# Patient Record
Sex: Male | Born: 1940 | Hispanic: Yes | Marital: Married | Smoking: Never smoker
Health system: Southern US, Community
[De-identification: ages and names within clinical notes are randomized; demographics above are authoritative.]

## PROBLEM LIST (undated history)

## (undated) DIAGNOSIS — K2970 Gastritis, unspecified, without bleeding: Secondary | ICD-10-CM

## (undated) DIAGNOSIS — I700 Atherosclerosis of aorta: Secondary | ICD-10-CM

## (undated) DIAGNOSIS — I5022 Chronic systolic (congestive) heart failure: Secondary | ICD-10-CM

## (undated) DIAGNOSIS — I4819 Other persistent atrial fibrillation: Secondary | ICD-10-CM

## (undated) DIAGNOSIS — D649 Anemia, unspecified: Secondary | ICD-10-CM

## (undated) DIAGNOSIS — I2699 Other pulmonary embolism without acute cor pulmonale: Secondary | ICD-10-CM

## (undated) DIAGNOSIS — K589 Irritable bowel syndrome without diarrhea: Secondary | ICD-10-CM

## (undated) DIAGNOSIS — J984 Other disorders of lung: Secondary | ICD-10-CM

## (undated) HISTORY — DX: Chronic systolic (congestive) heart failure: I50.22

## (undated) HISTORY — DX: Other pulmonary embolism without acute cor pulmonale: I26.99

## (undated) HISTORY — DX: Atherosclerosis of aorta: I70.0

## (undated) HISTORY — DX: Anemia, unspecified: D64.9

## (undated) HISTORY — DX: Irritable bowel syndrome, unspecified: K58.9

## (undated) HISTORY — DX: Other disorders of lung: J98.4

## (undated) HISTORY — DX: Other persistent atrial fibrillation: I48.19

---

## 2021-03-03 ENCOUNTER — Ambulatory Visit (INDEPENDENT_AMBULATORY_CARE_PROVIDER_SITE_OTHER): Payer: Self-pay

## 2021-03-03 ENCOUNTER — Encounter (HOSPITAL_COMMUNITY): Payer: Self-pay | Admitting: Emergency Medicine

## 2021-03-03 ENCOUNTER — Ambulatory Visit (HOSPITAL_COMMUNITY)
Admission: EM | Admit: 2021-03-03 | Discharge: 2021-03-03 | Disposition: A | Discharge status: Home / Self Care | Payer: Self-pay | Attending: Urgent Care | Admitting: Urgent Care

## 2021-03-03 ENCOUNTER — Other Ambulatory Visit: Payer: Self-pay

## 2021-03-03 ENCOUNTER — Emergency Department (HOSPITAL_COMMUNITY): Payer: Self-pay

## 2021-03-03 ENCOUNTER — Inpatient Hospital Stay (HOSPITAL_COMMUNITY)
Admission: EM | Admit: 2021-03-03 | Discharge: 2021-03-11 | DRG: 640 | Disposition: A | Discharge status: Home / Self Care | Payer: Self-pay | Source: Ambulatory Visit | Attending: Internal Medicine | Admitting: Internal Medicine

## 2021-03-03 DIAGNOSIS — E440 Moderate protein-calorie malnutrition: Secondary | ICD-10-CM | POA: Insufficient documentation

## 2021-03-03 DIAGNOSIS — K5900 Constipation, unspecified: Secondary | ICD-10-CM

## 2021-03-03 DIAGNOSIS — I452 Bifascicular block: Secondary | ICD-10-CM | POA: Diagnosis present

## 2021-03-03 DIAGNOSIS — R269 Unspecified abnormalities of gait and mobility: Secondary | ICD-10-CM | POA: Diagnosis present

## 2021-03-03 DIAGNOSIS — R1030 Lower abdominal pain, unspecified: Secondary | ICD-10-CM | POA: Diagnosis present

## 2021-03-03 DIAGNOSIS — I700 Atherosclerosis of aorta: Secondary | ICD-10-CM | POA: Diagnosis present

## 2021-03-03 DIAGNOSIS — R112 Nausea with vomiting, unspecified: Secondary | ICD-10-CM

## 2021-03-03 DIAGNOSIS — R9431 Abnormal electrocardiogram [ECG] [EKG]: Secondary | ICD-10-CM

## 2021-03-03 DIAGNOSIS — R531 Weakness: Secondary | ICD-10-CM

## 2021-03-03 DIAGNOSIS — I5021 Acute systolic (congestive) heart failure: Secondary | ICD-10-CM | POA: Diagnosis not present

## 2021-03-03 DIAGNOSIS — R001 Bradycardia, unspecified: Secondary | ICD-10-CM | POA: Diagnosis not present

## 2021-03-03 DIAGNOSIS — Z7901 Long term (current) use of anticoagulants: Secondary | ICD-10-CM

## 2021-03-03 DIAGNOSIS — I480 Paroxysmal atrial fibrillation: Secondary | ICD-10-CM

## 2021-03-03 DIAGNOSIS — I4581 Long QT syndrome: Secondary | ICD-10-CM | POA: Diagnosis present

## 2021-03-03 DIAGNOSIS — R140 Abdominal distension (gaseous): Secondary | ICD-10-CM | POA: Diagnosis present

## 2021-03-03 DIAGNOSIS — R54 Age-related physical debility: Secondary | ICD-10-CM | POA: Diagnosis present

## 2021-03-03 DIAGNOSIS — R0902 Hypoxemia: Secondary | ICD-10-CM

## 2021-03-03 DIAGNOSIS — E871 Hypo-osmolality and hyponatremia: Principal | ICD-10-CM | POA: Diagnosis present

## 2021-03-03 DIAGNOSIS — I429 Cardiomyopathy, unspecified: Secondary | ICD-10-CM | POA: Diagnosis present

## 2021-03-03 DIAGNOSIS — I4819 Other persistent atrial fibrillation: Secondary | ICD-10-CM | POA: Diagnosis present

## 2021-03-03 DIAGNOSIS — I110 Hypertensive heart disease with heart failure: Secondary | ICD-10-CM | POA: Diagnosis present

## 2021-03-03 DIAGNOSIS — E861 Hypovolemia: Secondary | ICD-10-CM | POA: Diagnosis present

## 2021-03-03 DIAGNOSIS — D649 Anemia, unspecified: Secondary | ICD-10-CM | POA: Diagnosis present

## 2021-03-03 DIAGNOSIS — Z20822 Contact with and (suspected) exposure to covid-19: Secondary | ICD-10-CM | POA: Diagnosis present

## 2021-03-03 DIAGNOSIS — E860 Dehydration: Secondary | ICD-10-CM

## 2021-03-03 DIAGNOSIS — K581 Irritable bowel syndrome with constipation: Secondary | ICD-10-CM | POA: Diagnosis present

## 2021-03-03 HISTORY — DX: Gastritis, unspecified, without bleeding: K29.70

## 2021-03-03 LAB — URINALYSIS, ROUTINE W REFLEX MICROSCOPIC
Bilirubin Urine: NEGATIVE
Glucose, UA: NEGATIVE mg/dL
Ketones, ur: 5 mg/dL — AB
Leukocytes,Ua: NEGATIVE
Nitrite: NEGATIVE
Protein, ur: NEGATIVE mg/dL
Specific Gravity, Urine: 1.019 (ref 1.005–1.030)
pH: 5 (ref 5.0–8.0)

## 2021-03-03 LAB — COMPREHENSIVE METABOLIC PANEL
ALT: 20 U/L (ref 0–44)
AST: 61 U/L — ABNORMAL HIGH (ref 15–41)
Albumin: 3.5 g/dL (ref 3.5–5.0)
Alkaline Phosphatase: 45 U/L (ref 38–126)
Anion gap: 8 (ref 5–15)
BUN: 10 mg/dL (ref 8–23)
CO2: 24 mmol/L (ref 22–32)
Calcium: 8.1 mg/dL — ABNORMAL LOW (ref 8.9–10.3)
Chloride: 82 mmol/L — ABNORMAL LOW (ref 98–111)
Creatinine, Ser: 0.93 mg/dL (ref 0.61–1.24)
GFR, Estimated: >60 mL/min (ref >60)
Glucose, Bld: 77 mg/dL (ref 70–99)
Potassium: 3.5 mmol/L (ref 3.5–5.1)
Sodium: 114 mmol/L — CL (ref 135–145)
Total Bilirubin: 0.7 mg/dL (ref 0.3–1.2)
Total Protein: 6.5 g/dL (ref 6.5–8.1)

## 2021-03-03 LAB — CREATININE, SERUM
Creatinine, Ser: 0.88 mg/dL (ref 0.61–1.24)
GFR, Estimated: >60 mL/min (ref >60)

## 2021-03-03 LAB — CBC WITH DIFFERENTIAL/PLATELET
Abs Immature Granulocytes: 0.01 10*3/uL (ref 0.00–0.07)
Basophils Absolute: 0.1 10*3/uL (ref 0.0–0.1)
Basophils Relative: 1 %
Eosinophils Absolute: 0.1 10*3/uL (ref 0.0–0.5)
Eosinophils Relative: 3 %
HCT: 30.4 % — ABNORMAL LOW (ref 39.0–52.0)
Hemoglobin: 11.4 g/dL — ABNORMAL LOW (ref 13.0–17.0)
Immature Granulocytes: 0 %
Lymphocytes Relative: 28 %
Lymphs Abs: 1.3 10*3/uL (ref 0.7–4.0)
MCH: 31.8 pg (ref 26.0–34.0)
MCHC: 37.5 g/dL — ABNORMAL HIGH (ref 30.0–36.0)
MCV: 84.9 fL (ref 80.0–100.0)
Monocytes Absolute: 0.5 10*3/uL (ref 0.1–1.0)
Monocytes Relative: 12 %
Neutro Abs: 2.5 10*3/uL (ref 1.7–7.7)
Neutrophils Relative %: 56 %
Platelets: 122 10*3/uL — ABNORMAL LOW (ref 150–400)
RBC: 3.58 MIL/uL — ABNORMAL LOW (ref 4.22–5.81)
RDW: 11.7 % (ref 11.5–15.5)
WBC: 4.4 10*3/uL (ref 4.0–10.5)
nRBC: 0 % (ref 0.0–0.2)

## 2021-03-03 LAB — TROPONIN I (HIGH SENSITIVITY)
Troponin I (High Sensitivity): 9 ng/L (ref <18)
Troponin I (High Sensitivity): 10 ng/L (ref <18)

## 2021-03-03 LAB — LIPASE, BLOOD: Lipase: 27 U/L (ref 11–51)

## 2021-03-03 LAB — TSH: TSH: 2.524 u[IU]/mL (ref 0.350–4.500)

## 2021-03-03 LAB — CBC

## 2021-03-03 LAB — OSMOLALITY: Osmolality: 242 mOsm/kg — CL (ref 275–295)

## 2021-03-03 LAB — RESP PANEL BY RT-PCR (FLU A&B, COVID) ARPGX2
Influenza A by PCR: NEGATIVE
Influenza B by PCR: NEGATIVE
SARS Coronavirus 2 by RT PCR: NEGATIVE

## 2021-03-03 MED ORDER — IOHEXOL 300 MG/ML  SOLN
100.0000 mL | Freq: Once | INTRAMUSCULAR | Status: AC | PRN
  Administered 2021-03-03: 100 mL via INTRAVENOUS

## 2021-03-03 MED ORDER — POLYETHYLENE GLYCOL 3350 17 G PO PACK: 17.0000 g | PACK | Freq: Every day | ORAL | Status: DC | PRN

## 2021-03-03 MED ORDER — ENOXAPARIN SODIUM 80 MG/0.8ML ~~LOC~~ SOLN
1.0000 mg/kg | Freq: Two times a day (BID) | SUBCUTANEOUS | Status: DC
  Administered 2021-03-04 – 2021-03-06 (×4): 72.5 mg via SUBCUTANEOUS
  Filled 2021-03-03: qty 0.72
  Filled 2021-03-03 (×4): qty 0.8

## 2021-03-03 MED ORDER — ONDANSETRON HCL 4 MG/2ML IJ SOLN
4.0000 mg | Freq: Once | INTRAMUSCULAR | Status: AC
  Administered 2021-03-03: 4 mg via INTRAMUSCULAR

## 2021-03-03 MED ORDER — ACETAMINOPHEN 325 MG PO TABS
650.0000 mg | ORAL_TABLET | Freq: Four times a day (QID) | ORAL | Status: DC | PRN
  Administered 2021-03-04 – 2021-03-09 (×5): 650 mg via ORAL
  Filled 2021-03-03 (×5): qty 2

## 2021-03-03 MED ORDER — PANTOPRAZOLE SODIUM 40 MG IV SOLR
40.0000 mg | Freq: Once | INTRAVENOUS | Status: AC
  Administered 2021-03-03: 40 mg via INTRAVENOUS
  Filled 2021-03-03: qty 40

## 2021-03-03 MED ORDER — SODIUM CHLORIDE 0.9 % IV SOLN
12.5000 mg | Freq: Four times a day (QID) | INTRAVENOUS | Status: DC | PRN
  Filled 2021-03-03: qty 0.5

## 2021-03-03 MED ORDER — ONDANSETRON HCL 4 MG/2ML IJ SOLN
INTRAMUSCULAR | Status: AC
  Filled 2021-03-03: qty 2

## 2021-03-03 MED ORDER — SODIUM CHLORIDE 0.9 % IV SOLN: INTRAVENOUS | Status: DC

## 2021-03-03 MED ORDER — ACETAMINOPHEN 650 MG RE SUPP: 650.0000 mg | Freq: Four times a day (QID) | RECTAL | Status: DC | PRN

## 2021-03-03 MED ORDER — LACTATED RINGERS IV BOLUS
1000.0000 mL | Freq: Once | INTRAVENOUS | Status: AC
  Administered 2021-03-03: 1000 mL via INTRAVENOUS

## 2021-03-03 NOTE — ED Triage Notes (Signed)
Emergency Medicine Provider Triage Evaluation Note  Glen Roy , a 80 y.o. male  was evaluated in triage.  Pt complains of  abd pain.  Review of Systems  Positive:  abd pain, nausea, vomit, decreased appetite Negative: Fever, cp, sob, cough  Physical Exam  BP 140/69 (BP Location: Right Arm)   Pulse 74   Temp 98.2 F (36.8 C) (Oral)   Resp 16   SpO2 100%  Gen:   Awake, no distress   HEENT:  Atraumatic  Resp:  Normal effort  Cardiac:  Normal rate  Abd:   Mild generalized tenderness, no guarding MSK:   Moves extremities without difficulty Neuro:  Doesn't say much  Medical Decision Making  Medically screening exam initiated at 4:28 PM.  Appropriate orders placed.  Glen Roy was informed that the remainder of the evaluation will be completed by another provider, this initial triage assessment does not replace that evaluation, and the importance of remaining in the ED until their evaluation is complete.  Clinical Impression  abd pain, decreased appetite, sent here from [facility].  Had negative abd xray.    Fayrene Helper, PA-C 03/03/21 1630

## 2021-03-03 NOTE — H&P (Addendum)
History and Physical    Glen Roy JOA:[id number] DOB: 1941/07/03 DOA: 03/03/2021  PCP: Pcp, No   Patient coming from: Home  Chief Complaint: Abdominal pain, constipation  HPI: Glen Roy is a 80 y.o. male with no sniffing a past medical history.  He recently moved from [place] to [place] to be with family and plans on staying here long-term but has not established with a PCP yet.  Patient and his son who was in the room with him did not speak [place] and is evaluated with the assistance of video interpreter service. He complains of abdominal pain, constipation, decreased appetite over the last 4 days.  Yesterday he began to have nausea and vomiting which is persisted throughout the day.  He reports having generalized weakness and just does not feel well.  There is no report of any loss of consciousness or seizure activity.  He does state he feels lightheaded when he stands up and has generalized fatigue when he tries to do any type of activity.  Reports he has not had a bowel movement since Thursday and is only had small amounts of a foamy stool for the last few days.  He does report having some burning sensation in his periumbilical abdomen that does not radiate.  He has not had any fever or chills.  Denies any chest pain, shortness of breath, cough, palpitations.  He has not had any change in diet prior to symptoms beginning.  He denies any raw or undercooked meat ingestion.  He lives with family.  No new pets or animal exposures. Denies tobacco, alcohol, illicit drug use  ED Course: Emergency room patient has been hemodynamically stable.  Patient is under mildly dehydrated is placed on IV fluids.  Sodium level is 114.  Creatinine 0.93 with a BUN of 10.  WBC of 4.4 hemoglobin 11.4 hematocrit 30.4.  Platelets 122.  Patient had some irregular rhythm on the monitor and a repeat EKG was obtained to the emergency room which shows atrial fibrillation with continued  prolonged QTC. Hospitalist services been asked to admit for further management  Review of Systems:  General: Reports generalized weakness. Denies fever, chills, weight loss, night sweats. Reports dizziness.   HENT: Denies head trauma, headache, denies change in hearing, tinnitus.  Denies nasal congestion or bleeding.  Denies sore throat, sores in mouth.  Denies difficulty swallowing Eyes: Denies blurry vision, pain in eye, drainage.  Denies discoloration of eyes. Neck: Denies pain.  Denies swelling.  Denies pain with movement. Cardiovascular: Denies chest pain, palpitations.  Denies edema.  Denies orthopnea Respiratory: Denies shortness of breath, cough.  Denies wheezing.  Denies sputum production Gastrointestinal: Reports abdominal pain, swelling. Reports nausea, vomiting, constipation.  Denies melena.  Denies hematemesis. Musculoskeletal: Denies limitation of movement.  Denies deformity or swelling. Denies arthralgias or myalgias. Genitourinary: Denies pelvic pain.  Denies urinary frequency or hesitancy.  Denies dysuria.  Skin: Denies rash.  Denies petechiae, purpura, ecchymosis. Neurological: Denies syncope. Denies seizure activity.  Denies  paresthesia.  Denies slurred speech, drooping face.  Denies visual change. Psychiatric: Denies depression, anxiety. Denies hallucinations.  Past Medical History:  Diagnosis Date  . Gastritis     History reviewed. No pertinent surgical history.  Social History  reports that he has never smoked. He has never used smokeless tobacco. He reports previous alcohol use. He reports that he does not use drugs.  No Known Allergies  Family History  Family history unknown: Yes     Prior to Admission medications  Not on File    Physical Exam: Vitals:   03/03/21 1930 03/03/21 2000 03/03/21 2015 03/03/21 2030  BP: (!) 145/71 129/74 (!) 125/58 (!) 143/69  Pulse: 90 77 (!) 58 82  Resp: 14 18 18 16   Temp:      TempSrc:      SpO2: 95% 98% 95% 94%     Constitutional: NAD, calm, comfortable Vitals:   03/03/21 1930 03/03/21 2000 03/03/21 2015 03/03/21 2030  BP: (!) 145/71 129/74 (!) 125/58 (!) 143/69  Pulse: 90 77 (!) 58 82  Resp: 14 18 18 16   Temp:      TempSrc:      SpO2: 95% 98% 95% 94%   General: WDWN, Alert and oriented x3.  Eyes: EOMI, PERRL, conjunctivae normal.  Sclera nonicteric HENT:  [place]/AT, external ears normal.  Nares patent without epistasis.  Mucous membranes are dry. Posterior pharynx clear of any exudate or lesions.  Neck: Soft, normal range of motion, supple, no masses, no thyromegaly. Trachea midline Respiratory: clear to auscultation bilaterally, no wheezing, no crackles. Normal respiratory effort. No accessory muscle use.  Cardiovascular: Regular rate and rhythm, no murmurs / rubs / gallops. No extremity edema. 2+ pedal pulses. Abdomen: Soft, diffuse tenderness to palpation, mild distension, no rebound or guarding.  No masses palpated. No hepatosplenomegaly. Bowel sounds hypoactive Musculoskeletal: FROM. no cyanosis. No joint deformity upper and lower extremities. Normal muscle tone.  Skin: Warm, dry, intact no rashes, lesions, ulcers. No induration Neurologic: CN 2-12 grossly intact.  Normal speech. Sensation intact, patella DTR +1 bilaterally. Strength 5/5 in all extremities.   Psychiatric: Normal judgment and insight.  Normal mood.    Labs on Admission: I have personally reviewed following labs and imaging studies  CBC: Recent Labs  Lab 03/03/21 1653  WBC 4.4  NEUTROABS 2.5  HGB 11.4*  HCT 30.4*  MCV 84.9  PLT 122*    Basic Metabolic Panel: Recent Labs  Lab 03/03/21 1653  NA 114*  K 3.5  CL 82*  CO2 24  GLUCOSE 77  BUN 10  CREATININE 0.93  CALCIUM 8.1*    GFR: CrCl cannot be calculated (Unknown ideal weight.).  Liver Function Tests: Recent Labs  Lab 03/03/21 1653  AST 61*  ALT 20  ALKPHOS 45  BILITOT 0.7  PROT 6.5  ALBUMIN 3.5    Urine analysis:    Component Value  Date/Time   COLORURINE YELLOW 03/03/2021 1653   APPEARANCEUR CLEAR 03/03/2021 1653   LABSPEC 1.019 03/03/2021 1653   PHURINE 5.0 03/03/2021 1653   GLUCOSEU NEGATIVE 03/03/2021 1653   HGBUR MODERATE (A) 03/03/2021 1653   BILIRUBINUR NEGATIVE 03/03/2021 1653   KETONESUR 5 (A) 03/03/2021 1653   PROTEINUR NEGATIVE 03/03/2021 1653   NITRITE NEGATIVE 03/03/2021 1653   LEUKOCYTESUR NEGATIVE 03/03/2021 1653    Radiological Exams on Admission: DG Abd 1 View  Result Date: 03/03/2021 CLINICAL DATA:  Lower abdominal pain, constipation. EXAM: ABDOMEN - 1 VIEW COMPARISON:  None. FINDINGS: The bowel gas pattern is normal. No radio-opaque calculi or other significant radiographic abnormality are seen. IMPRESSION: Negative. Electronically Signed   By: 03/05/2021 M.D.   On: 03/03/2021 14:50   CT ABDOMEN PELVIS W CONTRAST  Result Date: 03/03/2021 CLINICAL DATA:  Abdominal pain with vomiting and diarrhea. EXAM: CT ABDOMEN AND PELVIS WITH CONTRAST TECHNIQUE: Multidetector CT imaging of the abdomen and pelvis was performed using the standard protocol following bolus administration of intravenous contrast. CONTRAST:  03/05/2021 OMNIPAQUE IOHEXOL 300 MG/ML  SOLN COMPARISON:  Radiograph earlier today. FINDINGS: Lower chest: Mild cardiomegaly. Mild right lower lobe atelectasis. No confluent consolidation or pleural effusion. Hepatobiliary: Streak artifact from arms down positioning as well as overlying monitoring device. No focal liver lesion is seen. Unremarkable gallbladder. No calcified gallstone or pericholecystic fat stranding. Pancreas: No ductal dilatation or inflammation. Spleen: Normal in size without focal abnormality. Adrenals/Urinary Tract: No adrenal nodule. No hydronephrosis. Bilateral renal parenchymal thinning. Parapelvic cysts in the left kidney. No suspicious renal lesion. No renal stone. Partially distended urinary bladder, unremarkable. Stomach/Bowel: Bowel evaluation is limited in the absence of  enteric contrast, streak artifact from arms down positioning and paucity of intra-abdominal fat. Stomach is grossly normal. There is no bowel dilatation to suggest obstruction. Few fluid-filled loops of small bowel in the pelvis are nondilated or inflamed. Appendix tentatively visualized and normal, though not well evaluated. Air and stool throughout the colon without colonic wall thickening or inflammatory change. Vascular/Lymphatic: Aortic atherosclerosis and tortuosity. No aneurysm. No bulky abdominopelvic adenopathy. Reproductive: Prostate gland not well seen, may be diminutive or absent. Other: Trace free fluid in the pelvis. No free air. No abdominal wall hernia. Musculoskeletal: Mild T12 and L2 superior endplate compression fractures. Bones are diffusely under mineralized. Sclerotic focus in the right superior pubic ramus. No acute osseous abnormalities. IMPRESSION: 1. No acute abnormality in the abdomen/pelvis. No explanation for symptoms. 2. Trace free fluid in the pelvis is nonspecific, but may be reactive. 3. Mild T12 and L2 superior endplate compression fractures, age indeterminate. Aortic Atherosclerosis (ICD10-I70.0). Electronically Signed   By: Narda Rutherford M.D.   On: 03/03/2021 19:40    EKG: Independently reviewed.  EKG shows normal sinus rhythm with occasional PVCs.  No acute ST elevation or depression.  Baseline is erratic but there does appear to be P waves.  QTc 511. EKG is being obtained  Assessment/Plan Principal Problem:   Hyponatremia Mr. Tietje is admitted to Medical Telemetry floor. He has been having nausea and vomiting which could be etiology of hyponatremia, SIADH is also on differential. Obtaining serum osmolarity, urine osmolarity and urine sodium and potassium levels to further evaluate Patient started on normal saline at 100 ml/hr overnight. Check BMP every 6 hours to make sure her sodium does not rise too quickly.  Goal is to not have a greater than 6 mmol/L  increase over next 12 hours.  Placed on seizure precautions with sodium of 114.  Placed on fall precautions. If sodium does not improve by am will consult nephrology  Active Problems:    Paroxysmal atrial fibrillation Patient has no history of atrial fibrillation or arrhythmia.  Will check echocardiogram in the morning to evaluate wall motion, valve function and EF.  Lovenox will be converted to therapeutic anticoagulation with  A-fib.  Long-term anticoagulation risk and benefit will need to be discussed with patient and family before discharge  Generalized weakness Consult PT. Weakness most likely secondary to hyponatremia.     Constipation Reports no bowel movement in past few days. Abdominal/pelvic CT with no acute pathology identified.    Nausea & vomiting Phenergen for nausea and vomiting overnight as needed.  IVF hydration.     Prolonged QT interval Avoid medications which could further prolong QT interval. Monitor    DVT prophylaxis: Lovenox for anticoagulation Code Status:   Full code  Family Communication:  Diagnosis and plan discussed with patient and his son who was at the bedside through the interpreter service.  They agree with plan.  Questions answered.  Further recommendations to  follow as clinically indicated Disposition Plan:   Patient is from:  Home  Anticipated DC to:  Home  Anticipated DC date:  Anticipate 2 midnight or more stay in the hospital to treat acute condition  Anticipated DC barriers: No barriers to discharge identified at this time  Admission status:  Inpatient   Claudean SeveranceBradley S Leonidus Rowand MD [facility]  How to contact the [facility] Attending or Consulting provider 7A - 7P or covering provider during after hours 7P -7A, for this patient?   1. Check the care team in [facility] and look for a) attending/consulting [facility] provider listed and b) the [facility] team listed 2. Log into [email or web address] and use [facility] universal password to access. If you do not have  the password, please contact the hospital operator. 3. Locate the [facility] provider you are looking for under [facility] and page to a number that you can be directly reached. 4. If you still have difficulty reaching the provider, please page the [facility] (Director on Call) for the Hospitalists listed on amion for assistance.  03/03/2021, 8:47 PM    Addendum: Urine osmolality is low at 242.  Patient is dehydrated from nausea and vomiting.  Hyponatremia is most likely secondary to the vomiting.  Awaiting urine sodium level.  Continue IV fluid hydration and monitoring

## 2021-03-03 NOTE — Discharge Instructions (Signed)
Please report to the hospital for further evaluation of your abdominal pain, decreased appetite, weakness, nausea and vomiting. Your x-ray was negative for constipation and therefore it is my clinic opinion that you would benefit from a CT scan to further evaluate your abdominal pain.

## 2021-03-03 NOTE — ED Triage Notes (Addendum)
Patient presents to [facility] for evaluation of abdominal pain, weakness, anorexia.  Urban Gibson, APP at bedside at this time evaluating patient.  States he has not had a bowel movement in 4 days.

## 2021-03-03 NOTE — ED Notes (Signed)
Patient is being discharged from the Urgent Care and sent to the Emergency Department via private vehicle. Per Urban Gibson, APP, patient is in need of higher level of care due to no explanation for symptoms with weakness. Patient is aware and verbalizes understanding of plan of care.  Vitals:   03/03/21 1323  BP: (!) 143/64  Pulse: 79  Resp: 18  Temp: (!) 97.2 F (36.2 C)  SpO2: 98%  .

## 2021-03-03 NOTE — ED Notes (Signed)
Patient transported to CT 

## 2021-03-03 NOTE — ED Provider Notes (Signed)
[facility] EMERGENCY DEPARTMENT Provider Note   CSN: [id number] Arrival date & time: 03/03/21  1542     History Chief Complaint  Patient presents with  . Emesis    Glen Roy is a 80 y.o. male.  HPI      80 year old male with history of anemia presents with concern for lower abdominal pain, constipation, decreased appetite, and nausea.  He reports he has been unable to have a bowel movement since Thursday.  Only been passing small amounts of foam per rectum.  Reports a feeling of abdominal fullness, low appetite.  Has had generalized weakness for the past 2 days, lightheadedness, and fatigue.  He has been unable to eat much at all.  He has had vomiting for the last 2 days.  Denies fevers, urinary symptoms, syncope, history of abdominal surgeries.  Describes these symptoms as a burning in his abdomen, moderate in severity. Burning is located around umbilicus and above. Denies chest pain, dyspnea, cough, fever.  Much of history is through son (using interpreter)   Past Medical History:  Diagnosis Date  . Gastritis     Patient Active Problem List   Diagnosis Date Noted  . Hyponatremia 03/03/2021  . Generalized weakness 03/03/2021  . Constipation 03/03/2021  . Nausea & vomiting 03/03/2021  . Prolonged QT interval 03/03/2021    History reviewed. No pertinent surgical history.     Family History  Family history unknown: Yes    Social History   Tobacco Use  . Smoking status: Never Smoker  . Smokeless tobacco: Never Used  Substance Use Topics  . Alcohol use: Not Currently  . Drug use: Never    Home Medications Prior to Admission medications   Not on File    Allergies    Patient has no known allergies.  Review of Systems   Review of Systems  Constitutional: Positive for activity change, appetite change and fatigue. Negative for fever.  HENT: Negative for sore throat.   Eyes: Negative for visual disturbance.  Respiratory:  Negative for shortness of breath.   Cardiovascular: Negative for chest pain.  Gastrointestinal: Positive for abdominal pain, constipation, nausea and vomiting.  Genitourinary: Negative for difficulty urinating.  Musculoskeletal: Negative for back pain and neck stiffness.  Skin: Negative for rash.  Neurological: Positive for light-headedness. Negative for syncope and headaches.    Physical Exam Updated Vital Signs BP (!) 143/69   Pulse 82   Temp 98.2 F (36.8 C) (Oral)   Resp 16   SpO2 94%   Physical Exam Vitals and nursing note reviewed.  Constitutional:      General: He is not in acute distress.    Appearance: He is well-developed. He is not diaphoretic.  HENT:     Head: Normocephalic and atraumatic.  Eyes:     Conjunctiva/sclera: Conjunctivae normal.  Cardiovascular:     Rate and Rhythm: Normal rate and regular rhythm.     Heart sounds: Normal heart sounds. No murmur heard. No friction rub. No gallop.   Pulmonary:     Effort: Pulmonary effort is normal. No respiratory distress.     Breath sounds: Normal breath sounds. No wheezing or rales.  Abdominal:     General: There is distension.     Palpations: Abdomen is soft.     Tenderness: There is abdominal tenderness. There is no guarding.  Musculoskeletal:     Cervical back: Normal range of motion.  Skin:    General: Skin is warm and dry.  Neurological:  Mental Status: He is alert and oriented to person, place, and time.     ED Results / Procedures / Treatments   Labs (all labs ordered are listed, but only abnormal results are displayed) Labs Reviewed  CBC WITH DIFFERENTIAL/PLATELET - Abnormal; Notable for the following components:      Result Value   RBC 3.58 (*)    Hemoglobin 11.4 (*)    HCT 30.4 (*)    MCHC 37.5 (*)    Platelets 122 (*)    All other components within normal limits  COMPREHENSIVE METABOLIC PANEL - Abnormal; Notable for the following components:   Sodium 114 (*)    Chloride 82 (*)     Calcium 8.1 (*)    AST 61 (*)    All other components within normal limits  URINALYSIS, ROUTINE W REFLEX MICROSCOPIC - Abnormal; Notable for the following components:   Hgb urine dipstick MODERATE (*)    Ketones, ur 5 (*)    Bacteria, UA RARE (*)    All other components within normal limits  RESP PANEL BY RT-PCR (FLU A&B, COVID) ARPGX2  LIPASE, BLOOD  OSMOLALITY, URINE  NA AND K (SODIUM & POTASSIUM), RAND UR  OSMOLALITY  TROPONIN I (HIGH SENSITIVITY)    EKG EKG Interpretation  Date/Time:  Tuesday March 03 2021 20:29:04 EDT Ventricular Rate:  78 PR Interval:    QRS Duration: 121 QT Interval:  448 QTC Calculation: 511 R Axis:   151 Text Interpretation: Atrial fibrillation RBBB and LPFB ECG more clearly atrial fibrillation Confirmed by Alvira Monday ([id number]) on 03/03/2021 8:45:35 PM   Radiology DG Abd 1 View  Result Date: 03/03/2021 CLINICAL DATA:  Lower abdominal pain, constipation. EXAM: ABDOMEN - 1 VIEW COMPARISON:  None. FINDINGS: The bowel gas pattern is normal. No radio-opaque calculi or other significant radiographic abnormality are seen. IMPRESSION: Negative. Electronically Signed   By: Lupita Raider M.D.   On: 03/03/2021 14:50   CT ABDOMEN PELVIS W CONTRAST  Result Date: 03/03/2021 CLINICAL DATA:  Abdominal pain with vomiting and diarrhea. EXAM: CT ABDOMEN AND PELVIS WITH CONTRAST TECHNIQUE: Multidetector CT imaging of the abdomen and pelvis was performed using the standard protocol following bolus administration of intravenous contrast. CONTRAST:  OMNIPAQUE IOHEXOL 300 MG/ML  SOLN COMPARISON:  Radiograph earlier today. FINDINGS: Lower chest: Mild cardiomegaly. Mild right lower lobe atelectasis. No confluent consolidation or pleural effusion. Hepatobiliary: Streak artifact from arms down positioning as well as overlying monitoring device. No focal liver lesion is seen. Unremarkable gallbladder. No calcified gallstone or pericholecystic fat stranding. Pancreas: No  ductal dilatation or inflammation. Spleen: Normal in size without focal abnormality. Adrenals/Urinary Tract: No adrenal nodule. No hydronephrosis. Bilateral renal parenchymal thinning. Parapelvic cysts in the left kidney. No suspicious renal lesion. No renal stone. Partially distended urinary bladder, unremarkable. Stomach/Bowel: Bowel evaluation is limited in the absence of enteric contrast, streak artifact from arms down positioning and paucity of intra-abdominal fat. Stomach is grossly normal. There is no bowel dilatation to suggest obstruction. Few fluid-filled loops of small bowel in the pelvis are nondilated or inflamed. Appendix tentatively visualized and normal, though not well evaluated. Air and stool throughout the colon without colonic wall thickening or inflammatory change. Vascular/Lymphatic: Aortic atherosclerosis and tortuosity. No aneurysm. No bulky abdominopelvic adenopathy. Reproductive: Prostate gland not well seen, may be diminutive or absent. Other: Trace free fluid in the pelvis. No free air. No abdominal wall hernia. Musculoskeletal: Mild T12 and L2 superior endplate compression fractures. Bones are diffusely under mineralized.  Sclerotic focus in the right superior pubic ramus. No acute osseous abnormalities. IMPRESSION: 1. No acute abnormality in the abdomen/pelvis. No explanation for symptoms. 2. Trace free fluid in the pelvis is nonspecific, but may be reactive. 3. Mild T12 and L2 superior endplate compression fractures, age indeterminate. Aortic Atherosclerosis (ICD10-I70.0). Electronically Signed   By: Narda Rutherford M.D.   On: 03/03/2021 19:40    Procedures Procedures   Medications Ordered in ED Medications  pantoprazole (PROTONIX) injection 40 mg (40 mg Intravenous Given 03/03/21 1818)  lactated ringers bolus 1,000 mL (0 mLs Intravenous Stopped 03/03/21 1922)  iohexol (OMNIPAQUE) 300 MG/ML solution 100 mL (100 mLs Intravenous Contrast Given 03/03/21 1856)    ED Course  I  have reviewed the triage vital signs and the nursing notes.  Pertinent labs & imaging results that were available during my care of the patient were reviewed by me and considered in my medical decision making (see chart for details).    MDM Rules/Calculators/A&P                          80 year old male with history of anemia presents with concern for lower abdominal pain, constipation, decreased appetite, and nausea.  DDx includes appendicitis, pancreatitis, cholecystitis, pyelonephritis, nephrolithiasis, diverticulitis, SBO, colonic obstruction, constipation, AAA. Has tenderness, constipation, do not suspect cardiac etiology on initial evaluation-ECG obtained without acute changes, appears sinus.   CT abdomen pelvis without acute abnormalitiy, mild age indeterminate fractures of T12, L2 fractures-son reports he fell 3 years ago with back injury.    Labs significant for a sodium of 114, unknown prior, suspect at least some of this secondary to decreased po/dehydration. Has been seen for "dehydration" in [place] and [place], not on any medications other than vitamins.    EKG shows atrial fibrillation, no history of this.  Will admit for continued care of significant hyponatremia.     Final Clinical Impression(s) / ED Diagnoses Final diagnoses:  Hyponatremia  Dehydration  Constipation, unspecified constipation type    Rx / DC Orders ED Discharge Orders    None       Alvira Monday, MD 03/03/21 2047

## 2021-03-03 NOTE — ED Notes (Addendum)
While this RN was managing another situation in the lobby, this patient's family members came up and state they would like to know where they can go to get the care they need. This RN explained that patient could be evaluated by provider in room and have recommendations for care, or they could head to the ER.

## 2021-03-03 NOTE — ED Triage Notes (Signed)
Pt presents to ED POV. Per son pt c/o emesis and diarrhea x1d. Pt emesis x1. Pt given Zofran at [facility]

## 2021-03-03 NOTE — ED Provider Notes (Signed)
[facility]   MRN: [id number] DOB: [birth year]  Subjective:   Glen Roy is a 80 y.o. male presenting for 4 day history of acute onset persistent lower abdominal pain, constipation, decreased appetite, nausea, abdominal fullness, burping.  Patient presents with 2 family members who report that he has been taking iron supplementation for longstanding chronic anemia.  They deny history of diabetes, heart disease, high blood pressure.  Patient just came to [place] from [place] and has been here for the past month.  His family plans on keeping him in the long-term.  He has not yet established care with a new primary care provider.  No current facility-administered medications for this encounter. No current outpatient medications on file.   No Known Allergies  Past Medical History:  Diagnosis Date  . Gastritis      History reviewed. No pertinent surgical history.  Family History  Family history unknown: Yes    Social History   Tobacco Use  . Smoking status: Never Smoker  . Smokeless tobacco: Never Used  Substance Use Topics  . Alcohol use: Not Currently  . Drug use: Never    ROS   Objective:   Vitals: BP (!) 143/64 (BP Location: Right Arm)   Pulse 79   Temp (!) 97.2 F (36.2 C) (Temporal)   Resp 18   SpO2 98%   Physical Exam Constitutional:      General: He is not in acute distress.    Appearance: Normal appearance. He is well-developed. He is not ill-appearing, toxic-appearing or diaphoretic.  HENT:     Head: Normocephalic and atraumatic.     Right Ear: External ear normal.     Left Ear: External ear normal.     Nose: Nose normal.     Mouth/Throat:     Mouth: Mucous membranes are moist.     Pharynx: Oropharynx is clear.  Eyes:     General: No scleral icterus.    Extraocular Movements: Extraocular movements intact.     Pupils: Pupils are equal, round, and reactive to light.  Cardiovascular:     Rate and Rhythm:  Normal rate and regular rhythm.     Heart sounds: Normal heart sounds. No murmur heard. No friction rub. No gallop.   Pulmonary:     Effort: Pulmonary effort is normal. No respiratory distress.     Breath sounds: Normal breath sounds. No stridor. No wheezing, rhonchi or rales.  Abdominal:     General: Bowel sounds are normal. There is no distension.     Palpations: Abdomen is soft. There is no mass.     Tenderness: There is generalized abdominal tenderness and tenderness in the periumbilical area and suprapubic area. There is no right CVA tenderness, left CVA tenderness, guarding or rebound.  Skin:    General: Skin is warm and dry.  Neurological:     Mental Status: He is alert and oriented to person, place, and time.  Psychiatric:        Mood and Affect: Mood normal.        Behavior: Behavior normal.        Thought Content: Thought content normal.     DG Abd 1 View  Result Date: 03/03/2021 CLINICAL DATA:  Lower abdominal pain, constipation. EXAM: ABDOMEN - 1 VIEW COMPARISON:  None. FINDINGS: The bowel gas pattern is normal. No radio-opaque calculi or other significant radiographic abnormality are seen. IMPRESSION: Negative. Electronically Signed   By: Zenda Alpers.D.  On: 03/03/2021 14:50    Assessment and Plan :   PDMP not reviewed this encounter.  1. Lower abdominal pain   2. Weakness   3. Nausea and vomiting, intractability of vomiting not specified, unspecified vomiting type     Patient has undifferentiated abdominal pain with nausea, vomiting, weakness, decreased appetite.  X-ray was negative.  Patient was given IM Zofran in clinic. Will report to the ER for further evaluation including consideration for CT scan. Patient's family member verbalizes understanding and will report taken the patient to the ER now.    Wallis Bamberg, PA-C 03/03/21 1510

## 2021-03-04 LAB — BASIC METABOLIC PANEL
Anion gap: 7 (ref 5–15)
Anion gap: 7 (ref 5–15)
Anion gap: 8 (ref 5–15)
Anion gap: 5 (ref 5–15)
BUN: 10 mg/dL (ref 8–23)
BUN: 9 mg/dL (ref 8–23)
BUN: 9 mg/dL (ref 8–23)
BUN: 9 mg/dL (ref 8–23)
CO2: 22 mmol/L (ref 22–32)
CO2: 22 mmol/L (ref 22–32)
CO2: 22 mmol/L (ref 22–32)
CO2: 22 mmol/L (ref 22–32)
Calcium: 7.3 mg/dL — ABNORMAL LOW (ref 8.9–10.3)
Calcium: 7.6 mg/dL — ABNORMAL LOW (ref 8.9–10.3)
Calcium: 7.2 mg/dL — ABNORMAL LOW (ref 8.9–10.3)
Calcium: 7 mg/dL — ABNORMAL LOW (ref 8.9–10.3)
Chloride: 87 mmol/L — ABNORMAL LOW (ref 98–111)
Chloride: 88 mmol/L — ABNORMAL LOW (ref 98–111)
Chloride: 88 mmol/L — ABNORMAL LOW (ref 98–111)
Chloride: 88 mmol/L — ABNORMAL LOW (ref 98–111)
Creatinine, Ser: 0.82 mg/dL (ref 0.61–1.24)
Creatinine, Ser: 0.83 mg/dL (ref 0.61–1.24)
Creatinine, Ser: 0.81 mg/dL (ref 0.61–1.24)
Creatinine, Ser: 0.85 mg/dL (ref 0.61–1.24)
GFR, Estimated: >60 mL/min (ref >60)
GFR, Estimated: >60 mL/min (ref >60)
GFR, Estimated: >60 mL/min (ref >60)
GFR, Estimated: >60 mL/min (ref >60)
Glucose, Bld: 45 mg/dL — ABNORMAL LOW (ref 70–99)
Glucose, Bld: 99 mg/dL (ref 70–99)
Glucose, Bld: 45 mg/dL — ABNORMAL LOW (ref 70–99)
Glucose, Bld: 85 mg/dL (ref 70–99)
Potassium: 3.6 mmol/L (ref 3.5–5.1)
Potassium: 3.3 mmol/L — ABNORMAL LOW (ref 3.5–5.1)
Potassium: 3 mmol/L — ABNORMAL LOW (ref 3.5–5.1)
Potassium: 3.3 mmol/L — ABNORMAL LOW (ref 3.5–5.1)
Sodium: 116 mmol/L — CL (ref 135–145)
Sodium: 117 mmol/L — CL (ref 135–145)
Sodium: 118 mmol/L — CL (ref 135–145)
Sodium: 115 mmol/L — CL (ref 135–145)

## 2021-03-04 LAB — CBC
HCT: 27.2 % — ABNORMAL LOW (ref 39.0–52.0)
Hemoglobin: 10.2 g/dL — ABNORMAL LOW (ref 13.0–17.0)
MCH: 32.1 pg (ref 26.0–34.0)
MCHC: 37.5 g/dL — ABNORMAL HIGH (ref 30.0–36.0)
MCV: 85.5 fL (ref 80.0–100.0)
Platelets: 74 10*3/uL — ABNORMAL LOW (ref 150–400)
RBC: 3.18 MIL/uL — ABNORMAL LOW (ref 4.22–5.81)
RDW: 11.8 % (ref 11.5–15.5)
WBC: 3.4 10*3/uL — ABNORMAL LOW (ref 4.0–10.5)
nRBC: 0 % (ref 0.0–0.2)

## 2021-03-04 LAB — NA AND K (SODIUM & POTASSIUM), RAND UR
Potassium Urine: 121 mmol/L
Sodium, Ur: <10 mmol/L

## 2021-03-04 LAB — OSMOLALITY, URINE: Osmolality, Ur: 241 mOsm/kg — ABNORMAL LOW (ref 300–900)

## 2021-03-04 MED ORDER — SALINE SPRAY 0.65 % NA SOLN
1.0000 | NASAL | Status: DC | PRN
  Filled 2021-03-04: qty 44

## 2021-03-04 NOTE — TOC Initial Note (Addendum)
Transition of Care [facility]) - Initial/Assessment Note    Patient Details  Name: Glen Roy MRN: [id number] Date of Birth: 1941-10-11  Transition of Care [facility]) CM/SW Contact:    Curlene Labrum, RN Phone Number: 03/04/2021, 2:08 PM  Clinical Narrative:                 Case management met with the patient and son, Glen Roy in the room for discharge planning and transitions of care assessment.  The patient is staying with family in [place], [place] ([name], [name], [name] [address]) in a family trailer for the next month and then plans to return home to [place].  The patient and son both speak Spanish - spanish interpreter was used for assessment at the bedside.  The patient is covered by [facility] ID No. [id number] [phone] - Admitting is aware and documented this upon arrival to the ER at [facility].  [employer] is printed at the bottom of the card.  I spoke with the patient and he is agreeable to follow up at [facility] for hospital follow up after discharge  - listed in the discharge instructions.  Transportation will be provided by the family.  The family is able to assist with costs of medications and dme as needed.  Patient may not have insurance benefits in [place] according to admitting so [place] may be available for medication assistance - please check with leadership.  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  The patient currently receives no community resources at this time.  He was admitted for hyponatremia, abdominal pain and atrial fibrillation and remains on RA presently.  The patient states that he does not have or in need of dme at this time.  Benefit's check and cost determination for Xarelto and Eliquis was placed with [facility] administrative department.  Patient may need MATCH placed and medications filled through [facility] prior to discharge.  03/04/2021 1500 - CM spoke with Glen Roy, [facility]  supervisor and patient is eligible to be placed in [facility] closer to discharge to home for medication assistance.  CM and MSW will continue to follow the patient for discharge needs for home.    Expected Discharge Plan: Home/Self Care Barriers to Discharge: Inadequate or no insurance,Continued Medical Work up   Patient Goals and CMS Choice Patient states their goals for this hospitalization and ongoing recovery are:: Patient is visiting family for 1 month and wants to get better and go home - used Spanish interpreter CMS Medicare.gov Compare Post Acute Care list provided to:: Patient Choice offered to / list presented to : Patient  Expected Discharge Plan and Services Expected Discharge Plan: Home/Self Care In-house Referral: PCP / [facility],Interpreting Services Discharge Planning Services: CM Consult,Medication Assistance,Follow-up appt [facility]   Living arrangements for the past 2 months: Mobile Home (visiting and staying with family in [place] for 1 month and then returning home to [place])                                      Prior Living Arrangements/Services Living arrangements for the past 2 months: Mobile Home (visiting and staying with family in [place] for 1 month and then returning home to [place]) Lives with:: Adult Children Patient language and need for interpreter reviewed:: Yes Do you feel safe going back to the place where you live?: Yes  Need for Family Participation in Patient Care: Yes (Comment) Care giver support system in place?: Yes (comment)   Criminal Activity/Legal Involvement Pertinent to Current Situation/Hospitalization: No - Comment as needed  Activities of Daily Living      Permission Sought/Granted Permission sought to share information with : Case Manager,Family Supports,PCP Permission granted to share information with : Yes, Verbal Permission Granted     Permission granted to share info w AGENCY:  PCP  Permission granted to share info w Relationship: son, Glen Roy (speaks Spanish only) - [phone]     Emotional Assessment Appearance:: Appears stated age Attitude/Demeanor/Rapport: Engaged Affect (typically observed): Accepting Orientation: : Oriented to Self,Oriented to Place,Oriented to  Time,Oriented to Situation Alcohol / Substance Use: Not Applicable Psych Involvement: No (comment)  Admission diagnosis:  Dehydration [E86.0] Hyponatremia [E87.1] Constipation, unspecified constipation type [K59.00] Patient Active Problem List   Diagnosis Date Noted  . Hyponatremia 03/03/2021  . Generalized weakness 03/03/2021  . Constipation 03/03/2021  . Nausea & vomiting 03/03/2021  . Prolonged QT interval 03/03/2021  . AF (paroxysmal atrial fibrillation) ([place]) 03/03/2021   PCP:  Pcp, No Pharmacy:  No Pharmacies Listed    Social Determinants of Health (SDOH) Interventions    Readmission Risk Interventions Readmission Risk Prevention Plan 03/04/2021  Post Dischage Appt Complete  Medication Screening Complete  Transportation Screening Complete

## 2021-03-04 NOTE — ED Notes (Signed)
Pt assisted to bedside commode with 2 standby assist.

## 2021-03-04 NOTE — Progress Notes (Signed)
CRITICAL VALUE STICKER  CRITICAL VALUE: Sodium 117  RECEIVER (on-site recipient of call): Leen Tworek E  DATE & TIME NOTIFIED: 1413  MESSENGER (representative from lab): MCCORMICK K  MD NOTIFIED: E. [place], MD  TIME OF NOTIFICATION: 1438  RESPONSE: No new orders

## 2021-03-04 NOTE — Progress Notes (Signed)
Results for EBRAHIM, DEREMER (MRN [id number]) as of 03/04/2021 23:43  Ref. Range 03/04/2021 22:30  Sodium Latest Ref Range: 135 - 145 mmol/L 115 (LL)  MD paged.

## 2021-03-04 NOTE — Progress Notes (Signed)
NEW ADMISSION NOTE New Admission Note:   Arrival Method: Stretcher Mental Orientation: A&O x4  Telemetry: 5M11 Assessment: Completed Skin: See chart IV: Right wrist Pain: 0/10 Tubes: none present Safety Measures: Safety Fall Prevention Plan has been given, discussed and signed Admission: Completed [facility] Orientation: Patient has been orientated to the room, unit and staff.  Family: at bedside  Orders have been reviewed and implemented. Will continue to monitor the patient. Call light has been placed within reach and bed alarm has been activated.   Annia Belt, RN

## 2021-03-04 NOTE — Progress Notes (Signed)
CRITICAL VALUE STICKER  CRITICAL VALUE: Sodium 118  RECEIVER (on-site recipient of call): Alivya Wegman  DATE & TIME NOTIFIED: 03/04/2021 1848  MESSENGER (representative from lab): Marveen Reeks  MD NOTIFIED: Eric [place], DO  TIME OF NOTIFICATION: 1850  RESPONSE: New orders

## 2021-03-04 NOTE — ED Notes (Signed)
Spoke with son and pt with Nurse, learning disability.  Son is feeding pt presently, though he states this is not the norm.  Son states pt normally ambulates without difficulty, but the last 2 weeks has exp numbness and tingling in hands and feet and has difficulty ambulating.  Also states pt is very hard of hearing and must have a soft diet d/t not having any teeth.  Presently admitting speaking with pt/family.

## 2021-03-04 NOTE — Care Management (Signed)
CM called and was unable to reach the patient's son, Larey Days to complete initial assessment and transitions of care planning.  CM schedule PCP follow up with [facility] - placed in discharge instructions for follow up.  CMA with TOC contacted to place benefit's check and cost of Xarelto 15 mg po daily and Eliquis 2.5 mg po daily.  I asked that CNA place benefit's check and cost through [facility] since patient is most likely without documented insurance.  CM will continue to follow for TOC needs.

## 2021-03-04 NOTE — Progress Notes (Signed)
PROGRESS NOTE    Glen Roy  TDV:[id number] DOB: Nov 15, 1940 DOA: 03/03/2021 PCP: Pcp, No    Brief Narrative:  Glen Roy is a 80 year old male with no significant past medical history who presented to the ED on 4/26 with nausea/vomiting, abdominal pain and decreased appetite over the past 4 days.  Additionally, patient reported generalized weakness and overall feeling ill.  Patient reports feeling lightheaded when he stands up with generalized fatigue with any type of activity.  No bowel movement since Thursday.  Recently moved from [place] to [place] to be with family.  No change in dietary habits, no sick contacts.  Denies any raw or undercooked meat ingestion.  Denies headache, no chest pain, no palpitations, no shortness of breath, no cough.  Also denies tobacco, EtOH, illicit drug use.  In the ED, temperature 98.2 F, HR 74, RR 16, BP 140/69, SPO2 100% on room air.  Sodium 114, potassium 3.5, chloride 82, CO2 24, glucose 77, BUN 10, creatinine 0.93, AST 61, ALT 20, total bilirubin 0.7.  Lipase 27.  WBC 4.4, hemoglobin 11.4, platelets 122.  SARS-CoV-2 PCR negative.  Influenza A/B PCR negative.  Urinalysis unrevealing.  X-ray abdomen unrevealing.  CT abdomen/pelvis with contrast with no acute abnormality in the abdomen/pelvis, trace free fluid in the pelvis nonspecific, mild T12 and L2 superior endplate compression fracture and age-indeterminate.   Assessment & Plan:   Principal Problem:   Hyponatremia Active Problems:   Generalized weakness   Constipation   Nausea & vomiting   Prolonged QT interval   AF (paroxysmal atrial fibrillation) (HCC)   Severe hyponatremia Patient presenting to the ED with progressive weakness, nausea/vomiting and associated abdominal pain.  Poor oral intake in the preceding days.  Patient was found to have a sodium of 114 on admission.  Denies EtOH use.  Serum osmolality low at 242.  Urine osmolality low at 241, urine sodium  less than 10.  Suspect hypovolemic hyponatremia from extrarenal loss secondary to nausea/vomiting and poor oral intake. --Na 114>>117 --continue IVF hydration with NS at 167mL/hr --continue to monitor Na level q8h --supportive care  Nausea/vomiting Abdominal pain Abdominal x-ray and CT abdomen/pelvis with contrast unrevealing.  Suspect viral illness as patient is afebrile without leukocytosis.  No diarrhea.  No sick contacts. --Supportive care, antiemetics --Clear liquid diet, advance as tolerates  Paroxysmal atrial fibrillation, new diagnosis On admission, EKG notable for atrial fibrillation.  Denies past history of arrhythmia.  Currently rate controlled, borderline bradycardic. --Started on Lovenox treatment dose --TTE: Pending --Continue to monitor on telemetry  Aortic atherosclerosis Incidentally noted on CT abdomen/pelvis. --Check lipid panel  Weakness, deconditioning, gait disturbance -- PT/OT evaluation   DVT prophylaxis: Lovenox   Code Status: Full Code Family Communication: Updated patient's son who is present at bedside with aid of video translator.  Disposition Plan:  Level of care: Telemetry Medical Status is: Inpatient  Remains inpatient appropriate because:Ongoing diagnostic testing needed not appropriate for outpatient work up, Unsafe d/c plan, IV treatments appropriate due to intensity of illness or inability to take PO and Inpatient level of care appropriate due to severity of illness   Dispo: The patient is from: Home              Anticipated d/c is to: Home              Patient currently is not medically stable to d/c.   Difficult to place patient No   Consultants:   None  Procedures:   None  Antimicrobials:  None   Subjective: Patient seen and examined at bedside, resting comfortably.  Aided with video translator, [name] [phone].  Patient son present at bedside.  Patient continues with weakness and fatigue, slightly improved.  Continues  with mild nausea, improved with Phenergan.  No further abdominal pain.  No other questions or concerns at this time.  Discussed with him regarding IV fluid hydration for hyponatremia and new diagnosis of A. fib.  Denies headache, no visual changes, no chest pain, no palpitations, no shortness of breath, no vomiting/diarrhea, no current abdominal pain.  No acute concerns overnight per nursing staff.  Objective: Vitals:   03/04/21 1130 03/04/21 1135 03/04/21 1204 03/04/21 1647  BP: 135/80  (!) 149/72 124/68  Pulse: 77  65 61  Resp: 16  18 18   Temp:  (!) 96.6 F (35.9 C) 97.7 F (36.5 C) (!) 97.4 F (36.3 C)  TempSrc:  Axillary Oral Oral  SpO2: 97%  95% 95%  Weight:        Intake/Output Summary (Last 24 hours) at 03/04/2021 1817 Last data filed at 03/04/2021 1700 Gross per 24 hour  Intake 3938.33 ml  Output 2175 ml  Net 1763.33 ml   Filed Weights   03/03/21 2145  Weight: 73.1 kg    Examination:  General exam: Appears calm and comfortable, elderly in appearance Respiratory system: Clear to auscultation. Respiratory effort normal.  On room air Cardiovascular system: S1 & S2 heard, irregularly irregular rhythm, normal rate. No JVD, murmurs, rubs, gallops or clicks. No pedal edema. Gastrointestinal system: Abdomen is nondistended, soft and nontender. No organomegaly or masses felt. Normal bowel sounds heard. Central nervous system: Alert and oriented. No focal neurological deficits. Extremities: Symmetric 5 x 5 power. Skin: No rashes, lesions or ulcers Psychiatry: Judgement and insight appear normal. Mood & affect appropriate.     Data Reviewed: I have personally reviewed following labs and imaging studies  CBC: Recent Labs  Lab 03/03/21 1653 03/03/21 2136 03/04/21 0549  WBC 4.4 4.4 3.4*  NEUTROABS 2.5  --   --   HGB 11.4* 11.4* 10.2*  HCT 30.4* RESULTS UNAVAILABLE DUE TO INTERFERING SUBSTANCE 27.2*  MCV 84.9 RESULTS UNAVAILABLE DUE TO INTERFERING SUBSTANCE 85.5  PLT  122* 117* 74*   Basic Metabolic Panel: Recent Labs  Lab 03/03/21 1653 03/03/21 2136 03/04/21 0549 03/04/21 1212  NA 114*  --  116* 117*  K 3.5  --  3.6 3.3*  CL 82*  --  87* 88*  CO2 24  --  22 22  GLUCOSE 77  --  45* 99  BUN 10  --  10 9  CREATININE 0.93 0.88 0.82 0.83  CALCIUM 8.1*  --  7.3* 7.6*   GFR: CrCl cannot be calculated (Unknown ideal weight.). Liver Function Tests: Recent Labs  Lab 03/03/21 1653  AST 61*  ALT 20  ALKPHOS 45  BILITOT 0.7  PROT 6.5  ALBUMIN 3.5   Recent Labs  Lab 03/03/21 1653  LIPASE 27   No results for input(s): AMMONIA in the last 168 hours. Coagulation Profile: No results for input(s): INR, PROTIME in the last 168 hours. Cardiac Enzymes: No results for input(s): CKTOTAL, CKMB, CKMBINDEX, TROPONINI in the last 168 hours. BNP (last 3 results) No results for input(s): PROBNP in the last 8760 hours. HbA1C: No results for input(s): HGBA1C in the last 72 hours. CBG: No results for input(s): GLUCAP in the last 168 hours. Lipid Profile: No results for input(s): CHOL, HDL, LDLCALC, TRIG, CHOLHDL, LDLDIRECT in the last  72 hours. Thyroid Function Tests: Recent Labs    03/03/21 2136  TSH 2.524   Anemia Panel: No results for input(s): VITAMINB12, FOLATE, FERRITIN, TIBC, IRON, RETICCTPCT in the last 72 hours. Sepsis Labs: No results for input(s): PROCALCITON, LATICACIDVEN in the last 168 hours.  Recent Results (from the past 240 hour(s))  Resp Panel by RT-PCR (Flu A&B, Covid) Nasopharyngeal Swab     Status: None   Collection Time: 03/03/21  8:20 PM   Specimen: Nasopharyngeal Swab; Nasopharyngeal(NP) swabs in vial transport medium  Result Value Ref Range Status   SARS Coronavirus 2 by RT PCR NEGATIVE NEGATIVE Final    Comment: (NOTE) SARS-CoV-2 target nucleic acids are NOT DETECTED.  The SARS-CoV-2 RNA is generally detectable in upper respiratory specimens during the acute phase of infection. The lowest concentration of SARS-CoV-2  viral copies this assay can detect is 138 copies/mL. A negative result does not preclude SARS-Cov-2 infection and should not be used as the sole basis for treatment or other patient management decisions. A negative result may occur with  improper specimen collection/handling, submission of specimen other than nasopharyngeal swab, presence of viral mutation(s) within the areas targeted by this assay, and inadequate number of viral copies(<138 copies/mL). A negative result must be combined with clinical observations, patient history, and epidemiological information. The expected result is Negative.  Fact Sheet for Patients:  BloggerCourse.comhttps://www.fda.gov/media/152166/download  Fact Sheet for Healthcare Providers:  SeriousBroker.ithttps://www.fda.gov/media/152162/download  This test is no t yet approved or cleared by the [place] FDA and  has been authorized for detection and/or diagnosis of SARS-CoV-2 by FDA under an Emergency Use Authorization (EUA). This EUA will remain  in effect (meaning this test can be used) for the duration of the COVID-19 declaration under Section 564(b)(1) of the Act, 21 U.S.C.section 360bbb-3(b)(1), unless the authorization is terminated  or revoked sooner.       Influenza A by PCR NEGATIVE NEGATIVE Final   Influenza B by PCR NEGATIVE NEGATIVE Final    Comment: (NOTE) The Xpert Xpress SARS-CoV-2/FLU/RSV plus assay is intended as an aid in the diagnosis of influenza from Nasopharyngeal swab specimens and should not be used as a sole basis for treatment. Nasal washings and aspirates are unacceptable for Xpert Xpress SARS-CoV-2/FLU/RSV testing.  Fact Sheet for Patients: BloggerCourse.comhttps://www.fda.gov/media/152166/download  Fact Sheet for Healthcare Providers: SeriousBroker.ithttps://www.fda.gov/media/152162/download  This test is not yet approved or cleared by the [place] FDA and has been authorized for detection and/or diagnosis of SARS-CoV-2 by FDA under an Emergency Use Authorization  (EUA). This EUA will remain in effect (meaning this test can be used) for the duration of the COVID-19 declaration under Section 564(b)(1) of the Act, 21 U.S.C. section 360bbb-3(b)(1), unless the authorization is terminated or revoked.  Performed at [facility], [address] [place], [place] [address]          Radiology Studies: DG Abd 1 View  Result Date: 03/03/2021 CLINICAL DATA:  Lower abdominal pain, constipation. EXAM: ABDOMEN - 1 VIEW COMPARISON:  None. FINDINGS: The bowel gas pattern is normal. No radio-opaque calculi or other significant radiographic abnormality are seen. IMPRESSION: Negative. Electronically Signed   By: Lupita RaiderJames  Green Jr M.D.   On: 03/03/2021 14:50   CT ABDOMEN PELVIS W CONTRAST  Result Date: 03/03/2021 CLINICAL DATA:  Abdominal pain with vomiting and diarrhea. EXAM: CT ABDOMEN AND PELVIS WITH CONTRAST TECHNIQUE: Multidetector CT imaging of the abdomen and pelvis was performed using the standard protocol following bolus administration of intravenous contrast. CONTRAST:  100mL  OMNIPAQUE IOHEXOL 300 MG/ML  SOLN COMPARISON:  Radiograph earlier today. FINDINGS: Lower chest: Mild cardiomegaly. Mild right lower lobe atelectasis. No confluent consolidation or pleural effusion. Hepatobiliary: Streak artifact from arms down positioning as well as overlying monitoring device. No focal liver lesion is seen. Unremarkable gallbladder. No calcified gallstone or pericholecystic fat stranding. Pancreas: No ductal dilatation or inflammation. Spleen: Normal in size without focal abnormality. Adrenals/Urinary Tract: No adrenal nodule. No hydronephrosis. Bilateral renal parenchymal thinning. Parapelvic cysts in the left kidney. No suspicious renal lesion. No renal stone. Partially distended urinary bladder, unremarkable. Stomach/Bowel: Bowel evaluation is limited in the absence of enteric contrast, streak artifact from arms down positioning and paucity of intra-abdominal fat.  Stomach is grossly normal. There is no bowel dilatation to suggest obstruction. Few fluid-filled loops of small bowel in the pelvis are nondilated or inflamed. Appendix tentatively visualized and normal, though not well evaluated. Air and stool throughout the colon without colonic wall thickening or inflammatory change. Vascular/Lymphatic: Aortic atherosclerosis and tortuosity. No aneurysm. No bulky abdominopelvic adenopathy. Reproductive: Prostate gland not well seen, may be diminutive or absent. Other: Trace free fluid in the pelvis. No free air. No abdominal wall hernia. Musculoskeletal: Mild T12 and L2 superior endplate compression fractures. Bones are diffusely under mineralized. Sclerotic focus in the right superior pubic ramus. No acute osseous abnormalities. IMPRESSION: 1. No acute abnormality in the abdomen/pelvis. No explanation for symptoms. 2. Trace free fluid in the pelvis is nonspecific, but may be reactive. 3. Mild T12 and L2 superior endplate compression fractures, age indeterminate. Aortic Atherosclerosis (ICD10-I70.0). Electronically Signed   By: Narda Rutherford M.D.   On: 03/03/2021 19:40        Scheduled Meds: . enoxaparin (LOVENOX) injection  1 mg/kg Subcutaneous Q12H   Continuous Infusions: . sodium chloride 100 mL/hr at 03/04/21 0519  . promethazine (PHENERGAN) injection (IM or IVPB)       LOS: 1 day    Time spent: 39 minutes spent on chart review, discussion with nursing staff, consultants, updating family and interview/physical exam; more than 50% of that time was spent in counseling and/or coordination of care.    Alvira Philips [place], DO [facility] Available via Epic secure chat 7am-7pm After these hours, please refer to coverage provider listed on [email or web address] 03/04/2021, 6:17 PM

## 2021-03-04 NOTE — Evaluation (Signed)
Physical Therapy Evaluation Patient Details Name: Glen Roy MRN: [id number] DOB: 12/29/1940 Today's Date: 03/04/2021   History of Present Illness  Nels is an 80 y/o male who was sent to ED from urgent care after complaints of emesis, diarrhea, abdominal pain, nausea, and generalized weakness. CT and DG negative for acute findings. Pt admitted on 03/03/21 with hyponatremia. PMH includes anemia and PAF.    Clinical Impression  Pt with above diagnosis and subsequent problems. Received in bed, tired so most of history provided by sons who were present. Able to ambulate at min A level with most cueing needed to maintain RW at appropriate distance. No overt LOB, but mildly unsteady. Pt followed commands with increased time, but difficult to fully assess cognition due to language barrier and HOH. Would benefit from PT to improve balance, decrease risk for falls, and maximize independency prior to return home. Will continue to follow acutely.    Follow Up Recommendations Home health PT    Equipment Recommendations  Rolling walker with 5" wheels    Recommendations for Other Services       Precautions / Restrictions Precautions Precautions: Fall Precaution Comments: HOH Restrictions Weight Bearing Restrictions: No      Mobility  Bed Mobility Overal bed mobility: Needs Assistance Bed Mobility: Supine to Sit     Supine to sit: Supervision     General bed mobility comments: No physical assist given, when asked to scoot forward pt would not until his shoes were donned    Transfers Overall transfer level: Needs assistance Equipment used: Rolling walker (2 wheeled) Transfers: Sit to/from Stand Sit to Stand: Min guard         General transfer comment: Cueing for hand placement  Ambulation/Gait Ambulation/Gait assistance: Min assist Gait Distance (Feet): 60 Feet Assistive device: Rolling walker (2 wheeled) Gait Pattern/deviations: Step-through pattern;Trunk  flexed;Decreased stride length Gait velocity: decreased   General Gait Details: min A for safe navigation with RW and occassional steadying  Stairs            Wheelchair Mobility    Modified Rankin (Stroke Patients Only)       Balance Overall balance assessment: Needs assistance   Sitting balance-Leahy Scale: Fair       Standing balance-Leahy Scale: Fair                               Pertinent Vitals/Pain Pain Assessment: No/denies pain    Home Living Family/patient expects to be discharged to:: Private residence Living Arrangements: Children Available Help at Discharge: Family;Available 24 hours/day Type of Home: Mobile home Home Access: Stairs to enter Entrance Stairs-Rails: Can reach both;Left;Right Entrance Stairs-Number of Steps: 3 Home Layout: One level Home Equipment: None      Prior Function Level of Independence: Independent         Comments: Independent with ambulation and ADLs. No falls in last 6 months. Was moving well before getting sick     Hand Dominance        Extremity/Trunk Assessment   Upper Extremity Assessment Upper Extremity Assessment: Defer to OT evaluation    Lower Extremity Assessment Lower Extremity Assessment: Generalized weakness    Cervical / Trunk Assessment Cervical / Trunk Assessment: Kyphotic  Communication   Communication: Interpreter utilized;HOH  Cognition Arousal/Alertness: Awake/alert Behavior During Therapy: WFL for tasks assessed/performed Overall Cognitive Status: Difficult to assess  General Comments: A&Ox4. Difficult to fully assess due to language barrier and HOH      General Comments General comments (skin integrity, edema, etc.): Sons present and eager to assist with lines and mobilizing pt. PT politely explained their assistance wasn't needed, but they could observe.    Exercises     Assessment/Plan    PT Assessment Patient  needs continued PT services  PT Problem List Decreased strength;Decreased mobility;Decreased activity tolerance;Decreased balance;Decreased knowledge of use of DME       PT Treatment Interventions DME instruction;Gait training;Therapeutic exercise;Balance training;Stair training;Functional mobility training;Therapeutic activities;Patient/family education;Neuromuscular re-education    PT Goals (Current goals can be found in the Care Plan section)  Acute Rehab PT Goals Patient Stated Goal: get more blankets PT Goal Formulation: With patient Time For Goal Achievement: 03/18/21 Potential to Achieve Goals: Good    Frequency Min 3X/week   Barriers to discharge        Co-evaluation               AM-PAC PT "6 Clicks" Mobility  Outcome Measure Help needed turning from your back to your side while in a flat bed without using bedrails?: A Little Help needed moving from lying on your back to sitting on the side of a flat bed without using bedrails?: A Little Help needed moving to and from a bed to a chair (including a wheelchair)?: A Little Help needed standing up from a chair using your arms (e.g., wheelchair or bedside chair)?: A Little Help needed to walk in hospital room?: A Little Help needed climbing 3-5 steps with a railing? : A Lot 6 Click Score: 17    End of Session Equipment Utilized During Treatment: Gait belt Activity Tolerance: Patient tolerated treatment well Patient left: in bed;with call bell/phone within reach;with bed alarm set Nurse Communication: Mobility status PT Visit Diagnosis: Unsteadiness on feet (R26.81);Muscle weakness (generalized) (M62.81)    Time:  -      Charges:         Conley Rolls, SPT

## 2021-03-05 ENCOUNTER — Inpatient Hospital Stay (HOSPITAL_COMMUNITY): Payer: Self-pay

## 2021-03-05 DIAGNOSIS — I4891 Unspecified atrial fibrillation: Secondary | ICD-10-CM

## 2021-03-05 LAB — ECHOCARDIOGRAM COMPLETE
AR max vel: 2.29 cm2
AV Area VTI: 2.08 cm2
AV Area mean vel: 2 cm2
AV Mean grad: 2 mmHg
AV Peak grad: 3.6 mmHg
Ao pk vel: 0.95 m/s
Area-P 1/2: 4.31 cm2
Calc EF: 39.7 %
S' Lateral: 3.5 cm
Single Plane A2C EF: 40 %
Single Plane A4C EF: 40.6 %
Weight: 2574.97 oz

## 2021-03-05 LAB — BASIC METABOLIC PANEL
Anion gap: 9 (ref 5–15)
Anion gap: 5 (ref 5–15)
BUN: 7 mg/dL — ABNORMAL LOW (ref 8–23)
BUN: 7 mg/dL — ABNORMAL LOW (ref 8–23)
CO2: 20 mmol/L — ABNORMAL LOW (ref 22–32)
CO2: 24 mmol/L (ref 22–32)
Calcium: 7.1 mg/dL — ABNORMAL LOW (ref 8.9–10.3)
Calcium: 7.4 mg/dL — ABNORMAL LOW (ref 8.9–10.3)
Chloride: 89 mmol/L — ABNORMAL LOW (ref 98–111)
Chloride: 90 mmol/L — ABNORMAL LOW (ref 98–111)
Creatinine, Ser: 0.83 mg/dL (ref 0.61–1.24)
Creatinine, Ser: 0.89 mg/dL (ref 0.61–1.24)
GFR, Estimated: >60 mL/min (ref >60)
GFR, Estimated: >60 mL/min (ref >60)
Glucose, Bld: 71 mg/dL (ref 70–99)
Glucose, Bld: 107 mg/dL — ABNORMAL HIGH (ref 70–99)
Potassium: 3.6 mmol/L (ref 3.5–5.1)
Potassium: 3.5 mmol/L (ref 3.5–5.1)
Sodium: 118 mmol/L — CL (ref 135–145)
Sodium: 119 mmol/L — CL (ref 135–145)

## 2021-03-05 MED ORDER — LORATADINE 10 MG PO TABS
10.0000 mg | ORAL_TABLET | Freq: Every day | ORAL | Status: DC
  Administered 2021-03-05 – 2021-03-11 (×7): 10 mg via ORAL
  Filled 2021-03-05 (×7): qty 1

## 2021-03-05 MED ORDER — LOSARTAN POTASSIUM 25 MG PO TABS
25.0000 mg | ORAL_TABLET | Freq: Every day | ORAL | Status: DC
  Administered 2021-03-05 – 2021-03-06 (×2): 25 mg via ORAL
  Filled 2021-03-05 (×3): qty 1

## 2021-03-05 MED ORDER — POTASSIUM CHLORIDE CRYS ER 20 MEQ PO TBCR
30.0000 meq | EXTENDED_RELEASE_TABLET | Freq: Once | ORAL | Status: AC
  Administered 2021-03-05: 30 meq via ORAL
  Filled 2021-03-05: qty 1

## 2021-03-05 MED ORDER — POTASSIUM CHLORIDE 10 MEQ/100ML IV SOLN
10.0000 meq | INTRAVENOUS | Status: AC
  Administered 2021-03-05 (×2): 10 meq via INTRAVENOUS
  Filled 2021-03-05: qty 100

## 2021-03-05 MED ORDER — POTASSIUM CHLORIDE 10 MEQ/100ML IV SOLN: 10.0000 meq | INTRAVENOUS | Status: DC

## 2021-03-05 MED ORDER — LINACLOTIDE 145 MCG PO CAPS
290.0000 ug | ORAL_CAPSULE | Freq: Every day | ORAL | Status: DC
  Administered 2021-03-05 – 2021-03-06 (×2): 290 ug via ORAL
  Filled 2021-03-05 (×2): qty 2

## 2021-03-05 MED ORDER — FLUTICASONE PROPIONATE 50 MCG/ACT NA SUSP
2.0000 | Freq: Every day | NASAL | Status: DC
  Administered 2021-03-05 – 2021-03-11 (×7): 2 via NASAL
  Filled 2021-03-05: qty 16

## 2021-03-05 MED ORDER — POTASSIUM CHLORIDE IN NACL 20-0.9 MEQ/L-% IV SOLN
INTRAVENOUS | Status: DC
  Filled 2021-03-05 (×7): qty 1000

## 2021-03-05 MED ORDER — METOPROLOL TARTRATE 12.5 MG HALF TABLET
12.5000 mg | ORAL_TABLET | Freq: Two times a day (BID) | ORAL | Status: DC
  Administered 2021-03-05 – 2021-03-06 (×3): 12.5 mg via ORAL
  Filled 2021-03-05 (×4): qty 1

## 2021-03-05 NOTE — Plan of Care (Signed)
  Problem: Elimination: Goal: Will not experience complications related to bowel motility Outcome: Progressing   Problem: Elimination: Goal: Will not experience complications related to urinary retention Outcome: Progressing   

## 2021-03-05 NOTE — Progress Notes (Signed)
PROGRESS NOTE    Glen Roy  PRF:[id number] DOB: [birth year] DOA: 03/03/2021 PCP: Pcp, No    Brief Narrative:  Glen Roy is a 80 year old male with no significant past medical history who presented to the ED on 4/26 with nausea/vomiting, abdominal pain and decreased appetite over the past 4 days.  Additionally, patient reported generalized weakness and overall feeling ill.  Patient reports feeling lightheaded when he stands up with generalized fatigue with any type of activity.  No bowel movement since Thursday.  Recently moved from [place] to [place] to be with family.  No change in dietary habits, no sick contacts.  Denies any raw or undercooked meat ingestion.  Denies headache, no chest pain, no palpitations, no shortness of breath, no cough.  Also denies tobacco, EtOH, illicit drug use.  In the ED, temperature 98.2 F, HR 74, RR 16, BP 140/69, SPO2 100% on room air.  Sodium 114, potassium 3.5, chloride 82, CO2 24, glucose 77, BUN 10, creatinine 0.93, AST 61, ALT 20, total bilirubin 0.7.  Lipase 27.  WBC 4.4, hemoglobin 11.4, platelets 122.  SARS-CoV-2 PCR negative.  Influenza A/B PCR negative.  Urinalysis unrevealing.  X-ray abdomen unrevealing.  CT abdomen/pelvis with contrast with no acute abnormality in the abdomen/pelvis, trace free fluid in the pelvis nonspecific, mild T12 and L2 superior endplate compression fracture and age-indeterminate.   Assessment & Plan:   Principal Problem:   Hyponatremia Active Problems:   Generalized weakness   Constipation   Nausea & vomiting   Prolonged QT interval   AF (paroxysmal atrial fibrillation) (HCC)   Severe hyponatremia Patient presenting to the ED with progressive weakness, nausea/vomiting and associated abdominal pain.  Poor oral intake in the preceding days.  Patient was found to have a sodium of 114 on admission.  Denies EtOH use.  Serum osmolality low at 242.  Urine osmolality low at 241, urine sodium  less than 10.  Suspect hypovolemic hyponatremia from extrarenal loss secondary to nausea/vomiting and poor oral intake. --Na 114>>117>>119 --continue IVF hydration with NS at 144mL/hr --BMP daily --supportive care  Irritable bowel syndrome with constipation Abdominal x-ray and CT abdomen/pelvis with contrast unrevealing.  Suspect viral illness as patient is afebrile without leukocytosis.  No diarrhea.  No sick contacts.  Patient's son reports that patient used to take regularly medication from [place] called Brumurro de pinaverioo, which is an antispasmodic. --Start Linzess today --Supportive care, antiemetics --Clear liquid diet, advance as tolerates  Paroxysmal atrial fibrillation, new diagnosis On admission, EKG notable for atrial fibrillation.  Denies past history of arrhythmia.  Currently rate controlled, borderline bradycardic.  --Started on Lovenox treatment dose --TTE: Pending --Continue to monitor on telemetry  Aortic atherosclerosis Incidentally noted on CT abdomen/pelvis. --Check lipid panel  Weakness, deconditioning, gait disturbance --PT/OT recommends home health PT with rolling walker --Continue PT/OT efforts while inpatient   DVT prophylaxis: Lovenox   Code Status: Full Code Family Communication: Updated patient's son who is present at bedside with aid of video translator.  Disposition Plan:  Level of care: Telemetry Medical Status is: Inpatient  Remains inpatient appropriate because:Ongoing diagnostic testing needed not appropriate for outpatient work up, Unsafe d/c plan, IV treatments appropriate due to intensity of illness or inability to take PO and Inpatient level of care appropriate due to severity of illness   Dispo: The patient is from: Home              Anticipated d/c is to: Home  Patient currently is not medically stable to d/c.   Difficult to place patient No   Consultants:   None  Procedures:   None  Antimicrobials:    None   Subjective: Patient seen and examined at bedside, resting comfortably.  Aided with video translator, Molly MaduroRobert [phone].  Patient son present at bedside.  Patient reporting abdominal pain, bloating.  Still unable to tolerate oral intake.  Son reports that patient was previously taking a medication called Brumuro de pinaverioo that he obtained from [place] for "lazy intestines".  No other questions or concerns at this time. Sodium slowly improving.  Denies headache, no visual changes, no chest pain, no palpitations, no shortness of breath, no vomiting/diarrhea, no current abdominal pain.  No acute concerns overnight per nursing staff.  Objective: Vitals:   03/04/21 1647 03/04/21 2122 03/05/21 0518 03/05/21 1012  BP: 124/68 121/70 (!) 148/78 (!) 159/100  Pulse: 61 (!) 55 70 93  Resp: 18 16 16 18   Temp: (!) 97.4 F (36.3 C) 98.2 F (36.8 C) 97.8 F (36.6 C) 97.9 F (36.6 C)  TempSrc: Oral  Oral Oral  SpO2: 95% 96% 93% 97%  Weight:  73 kg      Intake/Output Summary (Last 24 hours) at 03/05/2021 1349 Last data filed at 03/05/2021 1252 Gross per 24 hour  Intake 3941.06 ml  Output 350 ml  Net 3591.06 ml   Filed Weights   03/03/21 2145 03/04/21 2122  Weight: 73.1 kg 73 kg    Examination:  General exam: Appears calm and comfortable, elderly in appearance Respiratory system: Clear to auscultation. Respiratory effort normal.  On room air Cardiovascular system: S1 & S2 heard, irregularly irregular rhythm, normal rate. No JVD, murmurs, rubs, gallops or clicks. No pedal edema. Gastrointestinal system: Abdomen is nondistended, soft and nontender. No organomegaly or masses felt. Normal bowel sounds heard. Central nervous system: Alert and oriented. No focal neurological deficits. Extremities: Symmetric 5 x 5 power. Skin: No rashes, lesions or ulcers Psychiatry: Judgement and insight appear normal. Mood & affect appropriate.     Data Reviewed: I have personally reviewed following  labs and imaging studies  CBC: Recent Labs  Lab 03/03/21 1653 03/03/21 2136 03/04/21 0549  WBC 4.4 4.4 3.4*  NEUTROABS 2.5  --   --   HGB 11.4* 11.4* 10.2*  HCT 30.4* RESULTS UNAVAILABLE DUE TO INTERFERING SUBSTANCE 27.2*  MCV 84.9 RESULTS UNAVAILABLE DUE TO INTERFERING SUBSTANCE 85.5  PLT 122* 117* 74*   Basic Metabolic Panel: Recent Labs  Lab 03/04/21 1212 03/04/21 1728 03/04/21 2230 03/05/21 0510 03/05/21 1157  NA 117* 118* 115* 118* 119*  K 3.3* 3.0* 3.3* 3.6 3.5  CL 88* 88* 88* 89* 90*  CO2 22 22 22  20* 24  GLUCOSE 99 45* 85 71 107*  BUN 9 9 9  7* 7*  CREATININE 0.83 0.81 0.85 0.83 0.89  CALCIUM 7.6* 7.2* 7.0* 7.1* 7.4*   GFR: CrCl cannot be calculated (Unknown ideal weight.). Liver Function Tests: Recent Labs  Lab 03/03/21 1653  AST 61*  ALT 20  ALKPHOS 45  BILITOT 0.7  PROT 6.5  ALBUMIN 3.5   Recent Labs  Lab 03/03/21 1653  LIPASE 27   No results for input(s): AMMONIA in the last 168 hours. Coagulation Profile: No results for input(s): INR, PROTIME in the last 168 hours. Cardiac Enzymes: No results for input(s): CKTOTAL, CKMB, CKMBINDEX, TROPONINI in the last 168 hours. BNP (last 3 results) No results for input(s): PROBNP in the last 8760 hours. HbA1C: No  results for input(s): HGBA1C in the last 72 hours. CBG: No results for input(s): GLUCAP in the last 168 hours. Lipid Profile: No results for input(s): CHOL, HDL, LDLCALC, TRIG, CHOLHDL, LDLDIRECT in the last 72 hours. Thyroid Function Tests: Recent Labs    03/03/21 2136  TSH 2.524   Anemia Panel: No results for input(s): VITAMINB12, FOLATE, FERRITIN, TIBC, IRON, RETICCTPCT in the last 72 hours. Sepsis Labs: No results for input(s): PROCALCITON, LATICACIDVEN in the last 168 hours.  Recent Results (from the past 240 hour(s))  Resp Panel by RT-PCR (Flu A&B, Covid) Nasopharyngeal Swab     Status: None   Collection Time: 03/03/21  8:20 PM   Specimen: Nasopharyngeal Swab;  Nasopharyngeal(NP) swabs in vial transport medium  Result Value Ref Range Status   SARS Coronavirus 2 by RT PCR NEGATIVE NEGATIVE Final    Comment: (NOTE) SARS-CoV-2 target nucleic acids are NOT DETECTED.  The SARS-CoV-2 RNA is generally detectable in upper respiratory specimens during the acute phase of infection. The lowest concentration of SARS-CoV-2 viral copies this assay can detect is 138 copies/mL. A negative result does not preclude SARS-Cov-2 infection and should not be used as the sole basis for treatment or other patient management decisions. A negative result may occur with  improper specimen collection/handling, submission of specimen other than nasopharyngeal swab, presence of viral mutation(s) within the areas targeted by this assay, and inadequate number of viral copies(<138 copies/mL). A negative result must be combined with clinical observations, patient history, and epidemiological information. The expected result is Negative.  Fact Sheet for Patients:  BloggerCourse.com  Fact Sheet for Healthcare Providers:  SeriousBroker.it  This test is no t yet approved or cleared by the [place] FDA and  has been authorized for detection and/or diagnosis of SARS-CoV-2 by FDA under an Emergency Use Authorization (EUA). This EUA will remain  in effect (meaning this test can be used) for the duration of the COVID-19 declaration under Section 564(b)(1) of the Act, 21 U.S.C.section 360bbb-3(b)(1), unless the authorization is terminated  or revoked sooner.       Influenza A by PCR NEGATIVE NEGATIVE Final   Influenza B by PCR NEGATIVE NEGATIVE Final    Comment: (NOTE) The Xpert Xpress SARS-CoV-2/FLU/RSV plus assay is intended as an aid in the diagnosis of influenza from Nasopharyngeal swab specimens and should not be used as a sole basis for treatment. Nasal washings and aspirates are unacceptable for Xpert Xpress  SARS-CoV-2/FLU/RSV testing.  Fact Sheet for Patients: BloggerCourse.com  Fact Sheet for Healthcare Providers: SeriousBroker.it  This test is not yet approved or cleared by the [place] FDA and has been authorized for detection and/or diagnosis of SARS-CoV-2 by FDA under an Emergency Use Authorization (EUA). This EUA will remain in effect (meaning this test can be used) for the duration of the COVID-19 declaration under Section 564(b)(1) of the Act, 21 U.S.C. section 360bbb-3(b)(1), unless the authorization is terminated or revoked.  Performed at [facility], [address] [place], [place] [address]          Radiology Studies: DG Abd 1 View  Result Date: 03/03/2021 CLINICAL DATA:  Lower abdominal pain, constipation. EXAM: ABDOMEN - 1 VIEW COMPARISON:  None. FINDINGS: The bowel gas pattern is normal. No radio-opaque calculi or other significant radiographic abnormality are seen. IMPRESSION: Negative. Electronically Signed   By: Lupita Raider M.D.   On: 03/03/2021 14:50   CT ABDOMEN PELVIS W CONTRAST  Result Date: 03/03/2021 CLINICAL DATA:  Abdominal pain  with vomiting and diarrhea. EXAM: CT ABDOMEN AND PELVIS WITH CONTRAST TECHNIQUE: Multidetector CT imaging of the abdomen and pelvis was performed using the standard protocol following bolus administration of intravenous contrast. CONTRAST:  OMNIPAQUE IOHEXOL 300 MG/ML  SOLN COMPARISON:  Radiograph earlier today. FINDINGS: Lower chest: Mild cardiomegaly. Mild right lower lobe atelectasis. No confluent consolidation or pleural effusion. Hepatobiliary: Streak artifact from arms down positioning as well as overlying monitoring device. No focal liver lesion is seen. Unremarkable gallbladder. No calcified gallstone or pericholecystic fat stranding. Pancreas: No ductal dilatation or inflammation. Spleen: Normal in size without focal abnormality. Adrenals/Urinary Tract: No  adrenal nodule. No hydronephrosis. Bilateral renal parenchymal thinning. Parapelvic cysts in the left kidney. No suspicious renal lesion. No renal stone. Partially distended urinary bladder, unremarkable. Stomach/Bowel: Bowel evaluation is limited in the absence of enteric contrast, streak artifact from arms down positioning and paucity of intra-abdominal fat. Stomach is grossly normal. There is no bowel dilatation to suggest obstruction. Few fluid-filled loops of small bowel in the pelvis are nondilated or inflamed. Appendix tentatively visualized and normal, though not well evaluated. Air and stool throughout the colon without colonic wall thickening or inflammatory change. Vascular/Lymphatic: Aortic atherosclerosis and tortuosity. No aneurysm. No bulky abdominopelvic adenopathy. Reproductive: Prostate gland not well seen, may be diminutive or absent. Other: Trace free fluid in the pelvis. No free air. No abdominal wall hernia. Musculoskeletal: Mild T12 and L2 superior endplate compression fractures. Bones are diffusely under mineralized. Sclerotic focus in the right superior pubic ramus. No acute osseous abnormalities. IMPRESSION: 1. No acute abnormality in the abdomen/pelvis. No explanation for symptoms. 2. Trace free fluid in the pelvis is nonspecific, but may be reactive. 3. Mild T12 and L2 superior endplate compression fractures, age indeterminate. Aortic Atherosclerosis (ICD10-I70.0). Electronically Signed   By: Narda Rutherford M.D.   On: 03/03/2021 19:40        Scheduled Meds: . enoxaparin (LOVENOX) injection  1 mg/kg Subcutaneous Q12H  . fluticasone  2 spray Each Nare Daily  . linaclotide  290 mcg Oral QAC breakfast  . loratadine  10 mg Oral Daily   Continuous Infusions: . 0.9 % NaCl with KCl 20 mEq / L 125 mL/hr at 03/05/21 0414  . promethazine (PHENERGAN) injection (IM or IVPB)       LOS: 2 days    Time spent: 38 minutes spent on chart review, discussion with nursing staff,  consultants, updating family and interview/physical exam; more than 50% of that time was spent in counseling and/or coordination of care.    Alvira Philips [place], DO [facility] Available via Epic secure chat 7am-7pm After these hours, please refer to coverage provider listed on [email or web address] 03/05/2021, 1:49 PM

## 2021-03-05 NOTE — Progress Notes (Incomplete)
  Echocardiogram 2D Echocardiogram has been performed.  Shirlean Kelly 03/05/2021, 2:48 PM

## 2021-03-05 NOTE — Evaluation (Signed)
Occupational Therapy Evaluation Patient Details Name: Glen Roy MRN: [id number] DOB: [birth year] Today's Date: 03/05/2021    History of Present Illness Glen Roy is an 80 y/o male who was sent to ED from urgent care after complaints of emesis, diarrhea, abdominal pain, nausea, and generalized weakness. CT and DG negative for acute findings. Pt admitted on 03/03/21 with hyponatremia. PMH includes anemia and PAF.   Clinical Impression   Pt admitted with the above diagnoses and presents with below problem list. Pt will benefit from continued acute OT to address the below listed deficits and maximize independence with basic ADLs prior to d/c home with family. At baseline, pt is independent with ADLs and mobility, utilizes no AD for walking, has no h/o recent falls. Pt currently needs up to min guard assist with LB ADLs, functional transfers and mobility. Pt r/o abdominal pain/discomfort "I'm distended", "constipated".Pt would benefit from walking with staff in the halls, 3x/day. Son present throughout session. In-person interpreter present as well.      Follow Up Recommendations  Home health OT;Supervision - Intermittent (OOB/mobility)    Equipment Recommendations  None recommended by OT    Recommendations for Other Services       Precautions / Restrictions Precautions Precautions: Fall Precaution Comments: HOH Restrictions Weight Bearing Restrictions: No      Mobility Bed Mobility Overal bed mobility: Needs Assistance Bed Mobility: Supine to Sit     Supine to sit: Supervision     General bed mobility comments: no physical assist needed. Pt immediately asking for his shoes.    Transfers Overall transfer level: Needs assistance Equipment used: None Transfers: Sit to/from Stand Sit to Stand: Min guard         General transfer comment: wide BOS, BUE righting reflex 2/2 unsteadiness upon standing. to/from EOB and recliner.    Balance Overall balance assessment:  Needs assistance   Sitting balance-Leahy Scale: Fair       Standing balance-Leahy Scale: Fair                             ADL either performed or assessed with clinical judgement   ADL Overall ADL's : Needs assistance/impaired Eating/Feeding: Set up;Sitting   Grooming: Set up;Min guard;Sitting;Standing   Upper Body Bathing: Set up;Sitting   Lower Body Bathing: Min guard;Sit to/from stand   Upper Body Dressing : Set up;Sitting   Lower Body Dressing: Min guard;Sit to/from stand   Toilet Transfer: Min guard;Ambulation;Comfort height toilet   Toileting- Clothing Manipulation and Hygiene: Set up;Min guard;Sitting/lateral lean;Sit to/from stand   Tub/ Shower Transfer: Min guard;Ambulation   Functional mobility during ADLs: Min guard General ADL Comments: Pt completed toilet transfer, 1 grooming task standing at the sink, and 2x walking household distance. Min guard for safety with mobility. Son reports his walking/balance are close to baseline.     Vision         Perception     Praxis      Pertinent Vitals/Pain Pain Assessment: Faces Faces Pain Scale: Hurts even more Pain Location: abdominal pain "distended" Pain Descriptors / Indicators: Grimacing;Discomfort Pain Intervention(s): Monitored during session;Limited activity within patient's tolerance;Repositioned     Hand Dominance     Extremity/Trunk Assessment Upper Extremity Assessment Upper Extremity Assessment: Generalized weakness   Lower Extremity Assessment Lower Extremity Assessment: Defer to PT evaluation;Generalized weakness   Cervical / Trunk Assessment Cervical / Trunk Assessment: Kyphotic   Communication Communication Communication: Interpreter utilized;HOH;Other (comment) (in person interpreter)  Cognition Arousal/Alertness: Awake/alert Behavior During Therapy: WFL for tasks assessed/performed Overall Cognitive Status: Difficult to assess                                  General Comments: Answers questions appropraitely. difficult to fully assess 2/2 very HOH and language barrier.   General Comments  Son present throughout session. In-person interpreter, Stark Bray present during session.    Exercises     Shoulder Instructions      Home Living Family/patient expects to be discharged to:: Private residence Living Arrangements: Children Available Help at Discharge: Family;Available 24 hours/day Type of Home: Mobile home Home Access: Stairs to enter Entrance Stairs-Number of Steps: 3 Entrance Stairs-Rails: Can reach both;Left;Right Home Layout: One level     Bathroom Shower/Tub: Tub/shower unit;Walk-in shower   Bathroom Toilet: Standard Bathroom Accessibility: Yes   Home Equipment: None          Prior Functioning/Environment Level of Independence: Independent        Comments: Independent with ambulation and ADLs. No falls in last 6 months. Was moving well before getting sick        OT Problem List: Decreased strength;Decreased activity tolerance;Impaired balance (sitting and/or standing);Decreased knowledge of use of DME or AE;Decreased knowledge of precautions;Pain      OT Treatment/Interventions: Self-care/ADL training;Energy conservation;DME and/or AE instruction;Therapeutic activities;Patient/family education;Balance training    OT Goals(Current goals can be found in the care plan section) Acute Rehab OT Goals Patient Stated Goal: not stated OT Goal Formulation: With patient/family Time For Goal Achievement: 03/19/21 Potential to Achieve Goals: Good ADL Goals Pt Will Perform Grooming: with modified independence;sitting;standing Pt Will Perform Lower Body Bathing: with modified independence;sit to/from stand Pt Will Perform Lower Body Dressing: with modified independence;sit to/from stand Pt Will Transfer to Toilet: with modified independence;ambulating Pt Will Perform Toileting - Clothing Manipulation and hygiene: with  modified independence;sitting/lateral leans;sit to/from stand  OT Frequency: Min 2X/week   Barriers to D/C:            Co-evaluation              AM-PAC OT "6 Clicks" Daily Activity     Outcome Measure Help from another person eating meals?: None Help from another person taking care of personal grooming?: None Help from another person toileting, which includes using toliet, bedpan, or urinal?: A Little Help from another person bathing (including washing, rinsing, drying)?: A Little Help from another person to put on and taking off regular upper body clothing?: None Help from another person to put on and taking off regular lower body clothing?: A Little 6 Click Score: 21   End of Session Nurse Communication: Mobility status;Precautions  Activity Tolerance: Patient tolerated treatment well Patient left: in chair;with call bell/phone within reach;with chair alarm set;with family/visitor present  OT Visit Diagnosis: Unsteadiness on feet (R26.81);Muscle weakness (generalized) (M62.81);Pain                Time: 2633-3545 OT Time Calculation (min): 31 min Charges:  OT General Charges $OT Visit: 1 Visit OT Evaluation $OT Eval Low Complexity: 1 Low OT Treatments $Self Care/Home Management : 8-22 mins  Raynald Kemp, OT Acute Rehabilitation Services Pager: [phone] Office: [phone]   Pilar Grammes 03/05/2021, 12:39 PM

## 2021-03-05 NOTE — TOC Progression Note (Signed)
Transition of Care [facility]) - Progression Note    Patient Details  Name: Glen Roy MRN: [id number] Date of Birth: Oct 16, 1941  Transition of [facility]) CM/SW Contact  Bess Kinds, RN Phone Number: [phone] 03/05/2021, 12:55 PM  Clinical Narrative:     Spoke with patient and son at bedside with the assistance of Yari [AD in staffing]. Patient lives in [place] where his daughter assists with his care needs. Typically ambulates without assist. He is here in [place]. visiting his son with intention to return to [place] once he has recovered. Agreeable to hospital follow up at [facility] for 6/13 9:30am with Dr. Jonah Blue - this was first available.   Discussed recommendations for [facility] PT. Advised of potential charity available pending qualifying status - may be able to get 3 visits. Discussed recommendations for DME RW. Offered options for finding a RW locally.   Discussed medication needs. Will follow for MATCH. Son stated that they can afford $3 copay per script. Advised of follow up for refills at [facility].   TOC following for transition needs.  Expected Discharge Plan: Home/Self Care Barriers to Discharge: Inadequate or no insurance,Continued Medical Work up  Expected Discharge Plan and Services Expected Discharge Plan: Home/Self Care In-house Referral: PCP / [facility],Interpreting Services Discharge Planning Services: CM Consult,Medication Assistance,Follow-up appt scheduled,Indigent Health Clinic   Living arrangements for the past 2 months: Mobile Home (visiting and staying with family in [place] for 1 month and then returning home to [place])                                       Social Determinants of Health (SDOH) Interventions    Readmission Risk Interventions Readmission Risk Prevention Plan 03/04/2021  Post Dischage Appt Complete  Medication Screening Complete  Transportation Screening Complete

## 2021-03-05 NOTE — TOC Progression Note (Signed)
Transition of Care [facility]) - Progression Note    Patient Details  Name: Glen Roy MRN: [id number] Date of Birth: 29-Dec-1940  Transition of Care [facility]) CM/SW Contact  Bess Kinds, RN Phone Number: [phone] 03/05/2021, 3:23 PM  Clinical Narrative:     Spoke with patient and son, Glen Roy, at the bedside with Eugenie Norrie for Spanish interpretation. Patient does not have social security number and is not eligible for charity DME through [employer]. Discussed where to get needed DME with [facility] who will follow up. Patient not eligible for charity DME - [name] agreeable for patient to go to outpatient PT at [facility]. Patient currently staying with son in [place]. Home address is [address], [place], [place] [address]. [name]'s number is [phone]. Advised of follow up appointment at [facility], patient to follow up if still in town. Will follow for MATCH needs.   Expected Discharge Plan: Home/Self Care Barriers to Discharge: Inadequate or no insurance,Continued Medical Work up  Expected Discharge Plan and Services Expected Discharge Plan: Home/Self Care In-house Referral: PCP / [facility],Interpreting Services Discharge Planning Services: CM Consult,Medication Assistance,Follow-up appt scheduled,[facility]igent [facility]   Living arrangements for the past 2 months: Mobile Home (visiting and staying with family in [place] for 1 month and then returning home to [place])                                       Social Determinants of Health (SDOH) Interventions    Readmission Risk Interventions Readmission Risk Prevention Plan 03/04/2021  Post Dischage Appt Complete  Medication Screening Complete  Transportation Screening Complete

## 2021-03-05 NOTE — Progress Notes (Signed)
TRH night shift.  The patient's sodium was 115 mmol/L.  Initial sodium was 114 and highest has 117 mmol/L since admission.  His potassium improved from 3.0 to 3.3 mmol/L.  Hello ma'am this is Glen Roy Dr. Shanon Rosser earlier overall sounders cussed we will discontinue with my worry doing since we could not do the cultures on her he continues to receive normal saline at 125 mL/h, but he is also on clear liquids diet.  I have restricted his fluids to 1200 mL/daily.  Sanda Klein, MD

## 2021-03-06 ENCOUNTER — Inpatient Hospital Stay (HOSPITAL_COMMUNITY): Payer: Self-pay

## 2021-03-06 ENCOUNTER — Other Ambulatory Visit (HOSPITAL_COMMUNITY): Payer: Self-pay

## 2021-03-06 DIAGNOSIS — I480 Paroxysmal atrial fibrillation: Secondary | ICD-10-CM

## 2021-03-06 DIAGNOSIS — I429 Cardiomyopathy, unspecified: Secondary | ICD-10-CM

## 2021-03-06 DIAGNOSIS — E871 Hypo-osmolality and hyponatremia: Principal | ICD-10-CM

## 2021-03-06 DIAGNOSIS — E440 Moderate protein-calorie malnutrition: Secondary | ICD-10-CM | POA: Insufficient documentation

## 2021-03-06 LAB — BASIC METABOLIC PANEL
Anion gap: 5 (ref 5–15)
BUN: <5 mg/dL — ABNORMAL LOW (ref 8–23)
CO2: 22 mmol/L (ref 22–32)
Calcium: 7.3 mg/dL — ABNORMAL LOW (ref 8.9–10.3)
Chloride: 93 mmol/L — ABNORMAL LOW (ref 98–111)
Creatinine, Ser: 0.86 mg/dL (ref 0.61–1.24)
GFR, Estimated: >60 mL/min (ref >60)
Glucose, Bld: 70 mg/dL (ref 70–99)
Potassium: 4.2 mmol/L (ref 3.5–5.1)
Sodium: 120 mmol/L — ABNORMAL LOW (ref 135–145)

## 2021-03-06 LAB — HEMOGLOBIN A1C
Hgb A1c MFr Bld: 5.1 % (ref 4.8–5.6)
Mean Plasma Glucose: 99.67 mg/dL

## 2021-03-06 LAB — SODIUM: Sodium: 120 mmol/L — ABNORMAL LOW (ref 135–145)

## 2021-03-06 LAB — LIPID PANEL
Cholesterol: 156 mg/dL (ref 0–200)
HDL: 42 mg/dL (ref >40)
LDL Cholesterol: 108 mg/dL — ABNORMAL HIGH (ref 0–99)
Total CHOL/HDL Ratio: 3.7 RATIO
Triglycerides: 29 mg/dL (ref <150)
VLDL: 6 mg/dL (ref 0–40)

## 2021-03-06 LAB — BRAIN NATRIURETIC PEPTIDE: B Natriuretic Peptide: 402.7 pg/mL — ABNORMAL HIGH (ref 0.0–100.0)

## 2021-03-06 LAB — MAGNESIUM: Magnesium: 1.3 mg/dL — ABNORMAL LOW (ref 1.7–2.4)

## 2021-03-06 MED ORDER — MAGNESIUM SULFATE 2 GM/50ML IV SOLN
2.0000 g | Freq: Once | INTRAVENOUS | Status: AC
  Administered 2021-03-06: 2 g via INTRAVENOUS
  Filled 2021-03-06: qty 50

## 2021-03-06 MED ORDER — ATORVASTATIN CALCIUM 10 MG PO TABS
20.0000 mg | ORAL_TABLET | Freq: Every day | ORAL | Status: DC
  Administered 2021-03-06 – 2021-03-11 (×6): 20 mg via ORAL
  Filled 2021-03-06 (×7): qty 2

## 2021-03-06 MED ORDER — LINACLOTIDE 145 MCG PO CAPS: 145.0000 ug | ORAL_CAPSULE | ORAL | Status: DC

## 2021-03-06 MED ORDER — DICYCLOMINE HCL 20 MG PO TABS: 20.0000 mg | ORAL_TABLET | Freq: Three times a day (TID) | ORAL | Status: DC | PRN

## 2021-03-06 MED ORDER — ENSURE ENLIVE PO LIQD
237.0000 mL | Freq: Two times a day (BID) | ORAL | Status: DC
  Administered 2021-03-06 – 2021-03-11 (×5): 237 mL via ORAL

## 2021-03-06 MED ORDER — APIXABAN 5 MG PO TABS
5.0000 mg | ORAL_TABLET | Freq: Two times a day (BID) | ORAL | Status: DC
  Administered 2021-03-06 – 2021-03-11 (×10): 5 mg via ORAL
  Filled 2021-03-06 (×11): qty 1

## 2021-03-06 MED ORDER — WHITE PETROLATUM EX OINT
TOPICAL_OINTMENT | CUTANEOUS | Status: AC
  Filled 2021-03-06: qty 28.35

## 2021-03-06 MED ORDER — ENOXAPARIN SODIUM 80 MG/0.8ML IJ SOSY: 1.0000 mg/kg | PREFILLED_SYRINGE | Freq: Two times a day (BID) | INTRAMUSCULAR | Status: DC

## 2021-03-06 MED ORDER — SODIUM CHLORIDE 1 G PO TABS
1.0000 g | ORAL_TABLET | Freq: Three times a day (TID) | ORAL | Status: DC
  Administered 2021-03-06 – 2021-03-09 (×7): 1 g via ORAL
  Filled 2021-03-06 (×9): qty 1

## 2021-03-06 NOTE — Progress Notes (Signed)
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  GOAL:   Patient will meet greater than or equal to 90% of their needs  MONITOR:   PO intake,Supplement acceptance,Labs,Weight trends,I & O's  REASON FOR ASSESSMENT:   Consult Assessment of nutrition requirement/status,Diet education  ASSESSMENT:   80 year old male who presented to the ED on 4/26 with N/V, abdominal pain, and decreased appetite x 4 days. PMH of gastritis.  Pt found to have severe hyponatremia on admission.   Per notes, pt has IBS with constipation. Pt used to take a medication from [place] called Bromuro de pinaverio which is an antispasmodic.  Pt also with new diagnosis of systolic congestive heart failure and paroxysmal atrial fibrillation. Cardiology has been consulted.  Nephrology consulted for further evaluation and recommendations regarding hyponatremia. Per Nephrology, pt with hypovolemic hyponatremia due to N/V and decreased oral intake.  Attempted to speak with pt at bedside. No family present. Interpretor utilized. Pt sleeping soundly but woke briefly to RD touch. RD explained and completed nutrition-focused physical exam. Asked pt if he had eaten anything yet today and pt did not answer. Noted lunch meal tray at bedside. Pt had only consumed the carrots from the meal tray. Pt unable to provide RD with nutrition history at time of visit.  Consult to dietitian states "Spanish speaker likes fruits and veggies,  doesn't have teeth so doesn't eat meat, education for family and patient needed." RD will downgrade diet to dysphagia 3 for ease of intake. RD will also order Ensure Enlive oral nutrition supplement to maximize protein intake.  "Heart Failure Nutrition Therapy for the Undernourished" diet education handout from the [employer] and [employer] was provided and left in pt's room. Will discuss handout and answer questions with pt and family as able on follow-up as no family was present during entirety of RD visit.  No weight history available in chart so unable to assess for weight changes.  Medications reviewed and include: sodium chloride 1 gram TID with meals, IV magnesium sulfate 2 grams once IVF: NS with KCl @ 75 ml/hr  Labs reviewed: sodium 120, magnesium 1.3, LDL 108  UOP: 650 ml x 12 hours I/O's: +7.5 L since admit  NUTRITION - FOCUSED PHYSICAL EXAM:  Flowsheet Row Most Recent Value  Orbital Region Moderate depletion  Upper Arm Region Moderate depletion  Thoracic and Lumbar Region Moderate depletion  Buccal Region Moderate depletion  Temple Region Mild depletion  Clavicle Bone Region Moderate depletion  Clavicle and Acromion Bone Region Moderate depletion  Scapular Bone Region Unable to assess  Dorsal Hand Mild depletion  Patellar Region Mild depletion  Anterior Thigh Region Mild depletion  Posterior Calf Region Mild depletion  Edema (RD Assessment) None  Hair Reviewed  Eyes Reviewed  Mouth Reviewed  Skin Reviewed  Nails Reviewed       Diet Order:   Diet Order            DIET DYS 3 Room service appropriate? Yes; Fluid consistency: Thin  Diet effective now  EDUCATION NEEDS:   Education needs have been addressed (Spanish handout provided)  Skin:  Skin Assessment: Reviewed RN Assessment  Last BM:  03/06/21 type 7  Height:   Ht Readings from Last 1 Encounters:  No data found for Ht    Weight:   Wt Readings from Last 1 Encounters:   03/05/21 63.5 kg    BMI:  There is no height or weight on file to calculate BMI.  Estimated Nutritional Needs:   Kcal:  1650-1850  Protein:  80-95 grams  Fluid:  1.6-1.8 L    Mertie Clause, MS, RD, LDN Inpatient Clinical Dietitian Please see AMiON for contact information.

## 2021-03-06 NOTE — Progress Notes (Signed)
TRH night shift.  The patient was seen after the nursing staff reported that the patient was hypoxic with an O2 saturation of 89%.   It resolved with supplemental oxygen up to 3 LPM via [place].  Breath sounds were diminished on both bases, but no crackles, wheezing or rhonchi heard.  He also had blood tinged phlegm along the expectoration, but may be bleeding from small laceration secondary to dryness of the lips.  The son also stated that he has been more tired than usual, mildly confused and forgetful lately, but he has been more pronounced earlier in the day.  A portable chest x-ray was done.  It showed cardiomegaly with findings concerning for for developing volume overload and a new small right-sided pleural effusion.  IVF decreased to 50 mL/h.  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  I explained to the son in plain terms Spanish his condition with a hyponatremia, systolic heart failure and atrial fibrillation. I also instructed him about the importance of fluid restriction treatment when the sodium level is low as the patient may have been drinking fluids beyond his restriction.  He voiced understanding.  Sanda Klein, MD.

## 2021-03-06 NOTE — Discharge Instructions (Signed)

## 2021-03-06 NOTE — Progress Notes (Signed)
PROGRESS NOTE    Glen Roy  QVZ:[id number] DOB: May 17, 1941 DOA: 03/03/2021 PCP: Pcp, No    Brief Narrative:  Glen Roy is a 80 year old male with no significant past medical history who presented to the ED on 4/26 with nausea/vomiting, abdominal pain and decreased appetite over the past 4 days.  Additionally, patient reported generalized weakness and overall feeling ill.  Patient reports feeling lightheaded when he stands up with generalized fatigue with any type of activity.  No bowel movement since Thursday.  Recently moved from [place] to [place] to be with family.  No change in dietary habits, no sick contacts.  Denies any raw or undercooked meat ingestion.  Denies headache, no chest pain, no palpitations, no shortness of breath, no cough.  Also denies tobacco, EtOH, illicit drug use.  In the ED, temperature 98.2 F, HR 74, RR 16, BP 140/69, SPO2 100% on room air.  Sodium 114, potassium 3.5, chloride 82, CO2 24, glucose 77, BUN 10, creatinine 0.93, AST 61, ALT 20, total bilirubin 0.7.  Lipase 27.  WBC 4.4, hemoglobin 11.4, platelets 122.  SARS-CoV-2 PCR negative.  Influenza A/B PCR negative.  Urinalysis unrevealing.  X-ray abdomen unrevealing.  CT abdomen/pelvis with contrast with no acute abnormality in the abdomen/pelvis, trace free fluid in the pelvis nonspecific, mild T12 and L2 superior endplate compression fracture and age-indeterminate.   Assessment & Plan:   Principal Problem:   Hyponatremia Active Problems:   Generalized weakness   Constipation   Nausea & vomiting   Prolonged QT interval   AF (paroxysmal atrial fibrillation) (HCC)   Severe hyponatremia Patient presenting to the ED with progressive weakness, nausea/vomiting and associated abdominal pain.  Poor oral intake in the preceding days.  Patient was found to have a sodium of 114 on admission.  Denies EtOH use.  Serum osmolality low at 242.  Urine osmolality low at 241, urine sodium  less than 10.  Suspect hypovolemic hyponatremia from extrarenal loss secondary to nausea/vomiting and poor oral intake. --Na 114>>117>>119>120 --continue IVF hydration with NS at 163mL/hr --nephrology consulted for further evaluation and recommendations given slow rise of sodium despite IV fluids, also concern with new diagnosis of systolic congestive heart failure --BMP q12h --supportive care  Irritable bowel syndrome with constipation Abdominal x-ray and CT abdomen/pelvis with contrast unrevealing.  Suspect viral illness as patient is afebrile without leukocytosis.  No diarrhea.  No sick contacts.  Patient's son reports that patient used to take regularly medication from [place] called Brumurro de pinaverioo, which is an antispasmodic. --Reduce Linzess to PO every other day --Supportive care, antiemetics --advance diet today  Paroxysmal atrial fibrillation, new diagnosis On admission, EKG notable for atrial fibrillation.  Denies past history of arrhythmia.  Currently rate controlled, borderline bradycardic.  --Started on Lovenox treatment dose; SW for benefit check for NOAC --Continue to monitor on telemetry  Systolic congestive heart failure, new diagnosis TTE with LVEF 35-40%, LV moderately decreased function with global hypokinesis, RV systolic function mildly reduced, LA/RA mildly dilated, mild/moderate MR, IVC normal in size.  BNP 402.7. --Cardiology consulted for further evaluation and recommendations  Aortic atherosclerosis Incidentally noted on CT abdomen/pelvis.  Total cholesterol 156, HDL 42, LDL 108.  Weakness, deconditioning, gait disturbance --PT/OT recommends home health PT with rolling walker --Continue PT/OT efforts while inpatient   DVT prophylaxis: Lovenox   Code Status: Full Code Family Communication: Updated patient's son who is present at bedside with aid of video translator.  Disposition Plan:  Level of care: Telemetry Medical  Status is:  Inpatient  Remains inpatient appropriate because:Ongoing diagnostic testing needed not appropriate for outpatient work up, Unsafe d/c plan, IV treatments appropriate due to intensity of illness or inability to take PO and Inpatient level of care appropriate due to severity of illness   Dispo: The patient is from: Home              Anticipated d/c is to: Home              Patient currently is not medically stable to d/c.   Difficult to place patient No   Consultants:   Cardiology  Nephrology, Dr. Ronalee Belts  Procedures:   TTE  Antimicrobials:   None   Subjective: Patient seen and examined at bedside, resting comfortably.  Son present at bedside.  Aided with Radiation protection practitioner, Charlott Rakes [phone].  Abdominal pain and bloating much improved, reports several bowel movements yesterday.  Clear liquid diet without nausea/vomiting.  Started on Linzess yesterday.  Discussed findings of echocardiogram with reduced EF with patient and son, consulting cardiology.  Sodium slowly improving, obtaining nephrology evaluation for further recommendations.  Denies headache, no visual changes, no chest pain, no palpitations, no shortness of breath, no vomiting/diarrhea, no current abdominal pain.  No acute concerns overnight per nursing staff.  Objective: Vitals:   03/05/21 1700 03/05/21 2107 03/06/21 0415 03/06/21 0914  BP: (!) 150/90 129/77 116/70 (!) 164/97  Pulse: 90 93 71 99  Resp: 17 18 18 17   Temp: 98 F (36.7 C) 98.1 F (36.7 C) 98.4 F (36.9 C)   TempSrc: Oral Oral Oral   SpO2: 98% 98% 92% (!) 89%  Weight:  63.5 kg      Intake/Output Summary (Last 24 hours) at 03/06/2021 1131 Last data filed at 03/06/2021 0800 Gross per 24 hour  Intake 3126.12 ml  Output 650 ml  Net 2476.12 ml   Filed Weights   03/03/21 2145 03/04/21 2122 03/05/21 2107  Weight: 73.1 kg 73 kg 63.5 kg    Examination:  General exam: Appears calm and comfortable, elderly in appearance Respiratory system: Clear to  auscultation. Respiratory effort normal.  On room air Cardiovascular system: S1 & S2 heard, irregularly irregular rhythm, normal rate. No JVD, murmurs, rubs, gallops or clicks. No pedal edema. Gastrointestinal system: Abdomen is nondistended, soft and nontender. No organomegaly or masses felt. Normal bowel sounds heard. Central nervous system: Alert and oriented. No focal neurological deficits. Extremities: Symmetric 5 x 5 power. Skin: No rashes, lesions or ulcers Psychiatry: Judgement and insight appear poor. Mood & affect appropriate.     Data Reviewed: I have personally reviewed following labs and imaging studies  CBC: Recent Labs  Lab 03/03/21 1653 03/03/21 2136 03/04/21 0549  WBC 4.4 4.4 3.4*  NEUTROABS 2.5  --   --   HGB 11.4* 11.4* 10.2*  HCT 30.4* RESULTS UNAVAILABLE DUE TO INTERFERING SUBSTANCE 27.2*  MCV 84.9 RESULTS UNAVAILABLE DUE TO INTERFERING SUBSTANCE 85.5  PLT 122* 117* 74*   Basic Metabolic Panel: Recent Labs  Lab 03/04/21 1728 03/04/21 2230 03/05/21 0510 03/05/21 1157 03/06/21 0604  NA 118* 115* 118* 119* 120*  K 3.0* 3.3* 3.6 3.5 4.2  CL 88* 88* 89* 90* 93*  CO2 22 22 20* 24 22  GLUCOSE 45* 85 71 107* 70  BUN 9 9 7* 7* <5*  CREATININE 0.81 0.85 0.83 0.89 0.86  CALCIUM 7.2* 7.0* 7.1* 7.4* 7.3*  MG  --   --   --   --  1.3*   GFR:  CrCl cannot be calculated (Unknown ideal weight.). Liver Function Tests: Recent Labs  Lab 03/03/21 1653  AST 61*  ALT 20  ALKPHOS 45  BILITOT 0.7  PROT 6.5  ALBUMIN 3.5   Recent Labs  Lab 03/03/21 1653  LIPASE 27   No results for input(s): AMMONIA in the last 168 hours. Coagulation Profile: No results for input(s): INR, PROTIME in the last 168 hours. Cardiac Enzymes: No results for input(s): CKTOTAL, CKMB, CKMBINDEX, TROPONINI in the last 168 hours. BNP (last 3 results) No results for input(s): PROBNP in the last 8760 hours. HbA1C: No results for input(s): HGBA1C in the last 72 hours. CBG: No results  for input(s): GLUCAP in the last 168 hours. Lipid Profile: Recent Labs    03/06/21 0604  CHOL 156  HDL 42  LDLCALC 108*  TRIG 29  CHOLHDL 3.7   Thyroid Function Tests: Recent Labs    03/03/21 2136  TSH 2.524   Anemia Panel: No results for input(s): VITAMINB12, FOLATE, FERRITIN, TIBC, IRON, RETICCTPCT in the last 72 hours. Sepsis Labs: No results for input(s): PROCALCITON, LATICACIDVEN in the last 168 hours.  Recent Results (from the past 240 hour(s))  Resp Panel by RT-PCR (Flu A&B, Covid) Nasopharyngeal Swab     Status: None   Collection Time: 03/03/21  8:20 PM   Specimen: Nasopharyngeal Swab; Nasopharyngeal(NP) swabs in vial transport medium  Result Value Ref Range Status   SARS Coronavirus 2 by RT PCR NEGATIVE NEGATIVE Final    Comment: (NOTE) SARS-CoV-2 target nucleic acids are NOT DETECTED.  The SARS-CoV-2 RNA is generally detectable in upper respiratory specimens during the acute phase of infection. The lowest concentration of SARS-CoV-2 viral copies this assay can detect is 138 copies/mL. A negative result does not preclude SARS-Cov-2 infection and should not be used as the sole basis for treatment or other patient management decisions. A negative result may occur with  improper specimen collection/handling, submission of specimen other than nasopharyngeal swab, presence of viral mutation(s) within the areas targeted by this assay, and inadequate number of viral copies(<138 copies/mL). A negative result must be combined with clinical observations, patient history, and epidemiological information. The expected result is Negative.  Fact Sheet for Patients:  BloggerCourse.comhttps://www.fda.gov/media/152166/download  Fact Sheet for Healthcare Providers:  SeriousBroker.ithttps://www.fda.gov/media/152162/download  This test is no t yet approved or cleared by the [place] FDA and  has been authorized for detection and/or diagnosis of SARS-CoV-2 by FDA under an Emergency Use Authorization  (EUA). This EUA will remain  in effect (meaning this test can be used) for the duration of the COVID-19 declaration under Section 564(b)(1) of the Act, 21 U.S.C.section 360bbb-3(b)(1), unless the authorization is terminated  or revoked sooner.       Influenza A by PCR NEGATIVE NEGATIVE Final   Influenza B by PCR NEGATIVE NEGATIVE Final    Comment: (NOTE) The Xpert Xpress SARS-CoV-2/FLU/RSV plus assay is intended as an aid in the diagnosis of influenza from Nasopharyngeal swab specimens and should not be used as a sole basis for treatment. Nasal washings and aspirates are unacceptable for Xpert Xpress SARS-CoV-2/FLU/RSV testing.  Fact Sheet for Patients: BloggerCourse.comhttps://www.fda.gov/media/152166/download  Fact Sheet for Healthcare Providers: SeriousBroker.ithttps://www.fda.gov/media/152162/download  This test is not yet approved or cleared by the [place] FDA and has been authorized for detection and/or diagnosis of SARS-CoV-2 by FDA under an Emergency Use Authorization (EUA). This EUA will remain in effect (meaning this test can be used) for the duration of the COVID-19 declaration under Section 564(b)(1) of the  Act, 21 U.S.C. section 360bbb-3(b)(1), unless the authorization is terminated or revoked.  Performed at [facility], [address] [place], [place] [address]          Radiology Studies: ECHOCARDIOGRAM COMPLETE  Result Date: 03/05/2021    ECHOCARDIOGRAM REPORT   Patient Name:   Glen Roy Date of Exam: 03/05/2021 Medical Rec #:  [id number]              Height: Accession #:    [id number]             Weight:       160.9 lb Date of Birth:  [birth year]              BSA:          1.747 m Patient Age:    80 years               BP:           148/78 mmHg Patient Gender: M                      HR:           70 bpm. Exam Location:  Inpatient Procedure: 2D Echo, Cardiac Doppler and Color Doppler Indications:    Atrial Fibrillation  History:        Patient has no prior history of  Echocardiogram examinations.  Sonographer:    Shirlean Kelly Referring Phys: [id number] Maeli Spacek J [place] IMPRESSIONS  1. Left ventricular ejection fraction, by estimation, is 35 to 40%. The left ventricle has moderately decreased function. The left ventricle demonstrates global hypokinesis. Left ventricular diastolic function could not be evaluated.  2. Right ventricular systolic function is mildly reduced. The right ventricular size is normal. There is normal pulmonary artery systolic pressure. The estimated right ventricular systolic pressure is 30.7 mmHg.  3. Left atrial size was mildly dilated.  4. Right atrial size was mildly dilated.  5. The mitral valve is grossly normal. Mild to moderate mitral valve regurgitation. No evidence of mitral stenosis.  6. The aortic valve is tricuspid. Aortic valve regurgitation is mild. No aortic stenosis is present.  7. The inferior vena cava is normal in size with greater than 50% respiratory variability, suggesting right atrial pressure of 3 mmHg. FINDINGS  Left Ventricle: Left ventricular ejection fraction, by estimation, is 35 to 40%. The left ventricle has moderately decreased function. The left ventricle demonstrates global hypokinesis. The left ventricular internal cavity size was normal in size. There is no left ventricular hypertrophy. Left ventricular diastolic function could not be evaluated due to atrial fibrillation. Left ventricular diastolic function could not be evaluated. Right Ventricle: The right ventricular size is normal. No increase in right ventricular wall thickness. Right ventricular systolic function is mildly reduced. There is normal pulmonary artery systolic pressure. The tricuspid regurgitant velocity is 2.63 m/s, and with an assumed right atrial pressure of 3 mmHg, the estimated right ventricular systolic pressure is 30.7 mmHg. Left Atrium: Left atrial size was mildly dilated. Right Atrium: Right atrial size was mildly dilated. Pericardium: Trivial  pericardial effusion is present. Mitral Valve: The mitral valve is grossly normal. Mild to moderate mitral valve regurgitation. No evidence of mitral valve stenosis. Tricuspid Valve: The tricuspid valve is grossly normal. Tricuspid valve regurgitation is mild . No evidence of tricuspid stenosis. Aortic Valve: The aortic valve is tricuspid. Aortic valve regurgitation is mild. No aortic stenosis is present. Aortic valve mean gradient measures 2.0  mmHg. Aortic valve peak gradient measures 3.6 mmHg. Aortic valve area, by VTI measures 2.08 cm. Pulmonic Valve: The pulmonic valve was grossly normal. Pulmonic valve regurgitation is not visualized. No evidence of pulmonic stenosis. Aorta: The aortic root and ascending aorta are structurally normal, with no evidence of dilitation. Venous: The inferior vena cava is normal in size with greater than 50% respiratory variability, suggesting right atrial pressure of 3 mmHg. IAS/Shunts: The atrial septum is grossly normal.  LEFT VENTRICLE PLAX 2D LVIDd:         4.90 cm LVIDs:         3.50 cm LV PW:         0.90 cm LV IVS:        0.80 cm LVOT diam:     2.00 cm LV SV:         38 LV SV Index:   22 LVOT Area:     3.14 cm  LV Volumes (MOD) LV vol d, MOD A2C: 87.5 ml LV vol d, MOD A4C: 85.9 ml LV vol s, MOD A2C: 52.5 ml LV vol s, MOD A4C: 51.0 ml LV SV MOD A2C:     35.0 ml LV SV MOD A4C:     85.9 ml LV SV MOD BP:      34.5 ml RIGHT VENTRICLE            IVC RV Basal diam:  3.00 cm    IVC diam: 1.50 cm RV S prime:     8.92 cm/s LEFT ATRIUM             Index       RIGHT ATRIUM           Index LA diam:        3.70 cm 2.12 cm/m  RA Area:     29.20 cm LA Vol (A2C):   91.6 ml 52.43 ml/m RA Volume:   105.50 ml 60.39 ml/m LA Vol (A4C):   93.0 ml 53.24 ml/m LA Biplane Vol: 97.8 ml 55.98 ml/m  AORTIC VALVE AV Area (Vmax):    2.29 cm AV Area (Vmean):   2.00 cm AV Area (VTI):     2.08 cm AV Vmax:           95.30 cm/s AV Vmean:          73.600 cm/s AV VTI:            0.181 m AV Peak Grad:       3.6 mmHg AV Mean Grad:      2.0 mmHg LVOT Vmax:         69.50 cm/s LVOT Vmean:        46.800 cm/s LVOT VTI:          0.120 m LVOT/AV VTI ratio: 0.66  AORTA Ao Root diam: 3.30 cm Ao Asc diam:  3.40 cm MITRAL VALVE               TRICUSPID VALVE MV Area (PHT): 4.31 cm    TR Peak grad:   27.7 mmHg MV Decel Time: 176 msec    TR Vmax:        263.00 cm/s MV E velocity: 98.50 cm/s                            SHUNTS  Systemic VTI:  0.12 m                            Systemic Diam: 2.00 cm Lennie Odor MD Electronically signed by Lennie Odor MD Signature Date/Time: 03/05/2021/3:29:28 PM    Final         Scheduled Meds: . enoxaparin (LOVENOX) injection  1 mg/kg Subcutaneous Q12H  . fluticasone  2 spray Each Nare Daily  . [START ON 03/08/2021] linaclotide  145 mcg Oral QODAY  . loratadine  10 mg Oral Daily  . losartan  25 mg Oral Daily  . metoprolol tartrate  12.5 mg Oral BID   Continuous Infusions: . 0.9 % NaCl with KCl 20 mEq / L 125 mL/hr at 03/05/21 2122  . promethazine (PHENERGAN) injection (IM or IVPB)       LOS: 3 days    Time spent: 40 minutes spent on chart review, discussion with nursing staff, consultants, updating family and interview/physical exam; more than 50% of that time was spent in counseling and/or coordination of care.    Alvira Philips [place], DO [facility] Available via Epic secure chat 7am-7pm After these hours, please refer to coverage provider listed on [email or web address] 03/06/2021, 11:31 AM

## 2021-03-06 NOTE — Consult Note (Addendum)
[facility] Nephrology Consultation Note  Requesting MD: Dr [place], Eric Reason for consult: Hyponatremia  HPI:  Glen Roy is a 80 y.o. male Spanish-speaking with no known significant past medical history admitted on 4/26 for nausea vomiting, decreased oral intake and abdominal pain, seen as a consultation for the evaluation of hyponatremia. Patient is a Spanish-speaking therefore we used language interpreter # [phone].  His son was at bedside.  Patient recently moved from [place] to  to be with the family.  He was brought in by family because of nausea, vomiting, not eating much associated with generalized weakness. In the ER he was found to be dehydrated with serum sodium level of 114, creatinine level 0.93.  The serum osmolality was 242 with unremarkable TSH level.  The urinalysis was bland with urine sodium less than 10 and urine osmolality 241.  He was treated with IV fluid with serum sodium level improving from 114 to 120 today.  He was a started on Linzess and MiraLAX and now developing diarrhea.  The echocardiogram with EF of 35 to 40% with moderately reduced LV function, this is a new finding to the patient.  Also found to have A. fib. He is now eating better without vomiting and abdomen pain is much better.  Denies headache, dizziness, chest pain, shortness of breath.  Already had 2-3 episodes of loose BM today. The current medications are NS with KCl at 125 cc an hour, losartan 25 mg, metoprolol.  BMET    Component Value Date/Time   NA 120 (L) 03/06/2021 0604   K 4.2 03/06/2021 0604   CL 93 (L) 03/06/2021 0604   CO2 22 03/06/2021 0604   GLUCOSE 70 03/06/2021 0604   BUN <5 (L) 03/06/2021 0604   CREATININE 0.86 03/06/2021 0604   CALCIUM 7.3 (L) 03/06/2021 0604   GFRNONAA >60 03/06/2021 0604     PMHx:   Past Medical History:  Diagnosis Date  . Gastritis     History reviewed. No pertinent surgical history.  Family Hx:  Family History   Family history unknown: Yes    Social History:  reports that he has never smoked. He has never used smokeless tobacco. He reports previous alcohol use. He reports that he does not use drugs.  Allergies: No Known Allergies  Medications: Prior to Admission medications   Medication Sig Start Date End Date Taking? Authorizing Provider  Ferrous Sulfate (IRON PO) Take 1 capsule by mouth every morning.   Yes [provider]  Multiple Vitamin (MULTIVITAMIN ADULT) TABS Take 1 tablet by mouth daily.   Yes [provider]  sodium chloride (OCEAN) 0.65 % SOLN nasal spray Place 1 spray into both nostrils as needed for congestion.   Yes [provider]    I have reviewed the patient's current medications.  Labs:  Results for orders placed or performed during the hospital encounter of 03/03/21 (from the past 48 hour(s))  Basic metabolic panel     Status: Abnormal   Collection Time: 03/04/21  5:28 PM  Result Value Ref Range   Sodium 118 (LL) 135 - 145 mmol/L    Comment: CRITICAL RESULT CALLED TO, READ BACK BY AND VERIFIED WITH: E BLUE RN [phone] K FORSYTH    Potassium 3.0 (L) 3.5 - 5.1 mmol/L   Chloride 88 (L) 98 - 111 mmol/L   CO2 22 22 - 32 mmol/L   Glucose, Bld 45 (L) 70 - 99 mg/dL    Comment: Glucose reference range applies only to  samples taken after fasting for at least 8 hours.   BUN 9 8 - 23 mg/dL   Creatinine, Ser 2.95 0.61 - 1.24 mg/dL   Calcium 7.2 (L) 8.9 - 10.3 mg/dL   GFR, Estimated >18 >84 mL/min    Comment: (NOTE) Calculated using the CKD-EPI Creatinine Equation (2021)    Anion gap 8 5 - 15    Comment: Performed at [facility], [address], [place], [place] [address]  Basic metabolic panel     Status: Abnormal   Collection Time: 03/04/21 10:30 PM  Result Value Ref Range   Sodium 115 (LL) 135 - 145 mmol/L    Comment: CRITICAL RESULT CALLED TO, READ BACK BY AND VERIFIED WITH: Barbaraann Cao, RN. [id number] 03/04/21 A. MCDOWELL    Potassium  3.3 (L) 3.5 - 5.1 mmol/L   Chloride 88 (L) 98 - 111 mmol/L   CO2 22 22 - 32 mmol/L   Glucose, Bld 85 70 - 99 mg/dL    Comment: Glucose reference range applies only to samples taken after fasting for at least 8 hours.   BUN 9 8 - 23 mg/dL   Creatinine, Ser 3.01 0.61 - 1.24 mg/dL   Calcium 7.0 (L) 8.9 - 10.3 mg/dL   GFR, Estimated >60 >10 mL/min    Comment: (NOTE) Calculated using the CKD-EPI Creatinine Equation (2021)    Anion gap 5 5 - 15    Comment: Performed at [facility], [address], [place], [place] [address]  Basic metabolic panel     Status: Abnormal   Collection Time: 03/05/21  5:10 AM  Result Value Ref Range   Sodium 118 (LL) 135 - 145 mmol/L    Comment: CRITICAL RESULT CALLED TO, READ BACK BY AND VERIFIED WITH: CMaryruth Hancock. 0551 03/05/21 A. MCDOWELL    Potassium 3.6 3.5 - 5.1 mmol/L   Chloride 89 (L) 98 - 111 mmol/L   CO2 20 (L) 22 - 32 mmol/L   Glucose, Bld 71 70 - 99 mg/dL    Comment: Glucose reference range applies only to samples taken after fasting for at least 8 hours.   BUN 7 (L) 8 - 23 mg/dL   Creatinine, Ser 5.73 0.61 - 1.24 mg/dL   Calcium 7.1 (L) 8.9 - 10.3 mg/dL   GFR, Estimated >22 >02 mL/min    Comment: (NOTE) Calculated using the CKD-EPI Creatinine Equation (2021)    Anion gap 9 5 - 15    Comment: Performed at [facility], [address] [place], [place] [address]  Basic metabolic panel     Status: Abnormal   Collection Time: 03/05/21 11:57 AM  Result Value Ref Range   Sodium 119 (LL) 135 - 145 mmol/L    Comment: CRITICAL RESULT CALLED TO, READ BACK BY AND VERIFIED WITH: A.CARTER RN 1325 03/05/21 MCCORMICK K    Potassium 3.5 3.5 - 5.1 mmol/L   Chloride 90 (L) 98 - 111 mmol/L   CO2 24 22 - 32 mmol/L   Glucose, Bld 107 (H) 70 - 99 mg/dL    Comment: Glucose reference range applies only to samples taken after fasting for at least 8 hours.   BUN 7 (L) 8 - 23 mg/dL   Creatinine, Ser 6.23 0.61 - 1.24 mg/dL   Calcium 7.4 (L) 8.9 -  10.3 mg/dL   GFR, Estimated >76 >28 mL/min    Comment: (NOTE) Calculated using the CKD-EPI Creatinine Equation (2021)    Anion gap 5 5 - 15  Comment: Performed at [facility], [address] [place], [place] [address]  Basic metabolic panel     Status: Abnormal   Collection Time: 03/06/21  6:04 AM  Result Value Ref Range   Sodium 120 (L) 135 - 145 mmol/L   Potassium 4.2 3.5 - 5.1 mmol/L   Chloride 93 (L) 98 - 111 mmol/L   CO2 22 22 - 32 mmol/L   Glucose, Bld 70 70 - 99 mg/dL    Comment: Glucose reference range applies only to samples taken after fasting for at least 8 hours.   BUN <5 (L) 8 - 23 mg/dL   Creatinine, Ser 6.040.86 0.61 - 1.24 mg/dL   Calcium 7.3 (L) 8.9 - 10.3 mg/dL   GFR, Estimated >54>60 >09>60 mL/min    Comment: (NOTE) Calculated using the CKD-EPI Creatinine Equation (2021)    Anion gap 5 5 - 15    Comment: Performed at [facility], [address] [place], [place] [address]  Magnesium     Status: Abnormal   Collection Time: 03/06/21  6:04 AM  Result Value Ref Range   Magnesium 1.3 (L) 1.7 - 2.4 mg/dL    Comment: Performed at [facility], [address] [place], [place] [address]  Brain natriuretic peptide     Status: Abnormal   Collection Time: 03/06/21  6:04 AM  Result Value Ref Range   B Natriuretic Peptide 402.7 (H) 0.0 - 100.0 pg/mL    Comment: Performed at [facility], [address] [place], [place] [address]  Lipid panel     Status: Abnormal   Collection Time: 03/06/21  6:04 AM  Result Value Ref Range   Cholesterol 156 0 - 200 mg/dL   Triglycerides 29 <086<150 mg/dL   HDL 42 >57>40 mg/dL   Total CHOL/HDL Ratio 3.7 RATIO   VLDL 6 0 - 40 mg/dL   LDL Cholesterol 846108 (H) 0 - 99 mg/dL    Comment:        Total Cholesterol/HDL:CHD Risk Coronary Heart Disease Risk Table                     Men   Women  1/2 Average Risk   3.4   3.3  Average Risk       5.0   4.4  2 X Average Risk   9.6   7.1  3 X Average Risk  23.4   11.0        Use  the calculated Patient Ratio above and the CHD Risk Table to determine the patient's CHD Risk.        ATP III CLASSIFICATION (LDL):  <100     mg/dL   Optimal  [id number]  mg/dL   Near or Above                    Optimal  130-159  mg/dL   Borderline  [id number]  mg/dL   High  >401>190     mg/dL   Very High Performed at [facility], [address] [place], [place] [address]      ROS:  Pertinent items noted in HPI and remainder of comprehensive ROS otherwise negative.  Physical Exam: Vitals:   03/06/21 0415 03/06/21 0914  BP: 116/70 (!) 164/97  Pulse: 71 99  Resp: 18 17  Temp: 98.4 F (36.9 C)   SpO2: 92% (!) 89%     General exam: Elderly male lying on  bed comfortable, not in distress Respiratory system: Clear to auscultation. Respiratory effort normal. No wheezing or crackle Cardiovascular system: S1 & S2 heard, RRR.  No pedal edema. Gastrointestinal system: Abdomen is nondistended, soft and nontender. Normal bowel sounds heard. Central nervous system: Alert awake and following commands Extremities: Symmetric 5 x 5 power. Skin: No rashes, lesions or ulcers  Assessment/Plan:  #Hypovolemic hyponatremia due to nausea vomiting and decreased oral intake.  TSH level acceptable.  Labs consistent with dehydration.  He looks euvolemic today after receiving IV fluid.  The serum sodium level improved from 114 to 120 in last 3 to 4 days.  Given new diagnosis of CHF, I will reduce the rate of IV fluid to 75 cc an hour.  Start salt tablet.  Monitor lab.  No need for hypertonic saline.  #Nausea vomiting and constipation: CT scan of abdomen pelvis unremarkable.  He was a started on Linzess and MiraLAX and now having diarrhea.  I will discontinue Linzess and on MiraLAX as needed.  #Hypertension: On metoprolol and losartan.  Monitor blood pressure.  #New diagnosis of systolic CHF, cardiology has been consulted.  Patient is on losartan and metoprolol.  Well compensated.  #Hypomagnesemia:  Replete magnesium IV.  Monitor lab.  #Generalized weakness/deconditioning: PT OT eval and supportive care.  Discussed with the patient's son.  Used Engineer, structural.  Thank you for the consult, we will follow with you.  Cameo Schmiesing Jaynie Collins 03/06/2021, 1:47 PM  [facility].

## 2021-03-06 NOTE — Progress Notes (Signed)
Physical Therapy Treatment Patient Details Name: Glen Roy MRN: [id number] DOB: Nov 23, 1940 Today's Date: 03/06/2021    History of Present Illness Glen Roy is an 80 y/o male who was sent to ED from urgent care after complaints of emesis, diarrhea, abdominal pain, nausea, and generalized weakness. CT and DG negative for acute findings. Pt admitted on 03/03/21 with hyponatremia. PMH includes anemia and PAF.    PT Comments    Pt received in bed with son and in-person interpreter present. Progressing towards PT goals. Generally min guard for mobility. No overt LOB, but pt with relatively increased gait speed and short steps. Suspect pt would have difficulty with higher level dynamic tasks. Cueing for pacing. Pt left in chair with all needs met, call bell within reach, and son & interpreter present. Encouraged pt to sit upright for a few hours to help promote airway clearance. Will continue to follow acutely.    Follow Up Recommendations  Home health PT     Equipment Recommendations  None recommended by PT    Recommendations for Other Services       Precautions / Restrictions Precautions Precautions: Fall Precaution Comments: HOH Restrictions Weight Bearing Restrictions: No    Mobility  Bed Mobility Overal bed mobility: Needs Assistance Bed Mobility: Supine to Sit     Supine to sit: Supervision     General bed mobility comments: no physical assist needed. Pt immediately asking for his shoes.    Transfers Overall transfer level: Needs assistance Equipment used: None Transfers: Sit to/from Stand Sit to Stand: Min guard            Ambulation/Gait Ambulation/Gait assistance: Min guard Gait Distance (Feet): 150 Feet Assistive device: None Gait Pattern/deviations: Step-through pattern;Trunk flexed;Decreased stride length;Narrow base of support Gait velocity: increased speed   General Gait Details: min guard for safety   Stairs              Wheelchair Mobility    Modified Rankin (Stroke Patients Only)       Balance     Sitting balance-Leahy Scale: Good       Standing balance-Leahy Scale: Fair                              Cognition Arousal/Alertness: Awake/alert Behavior During Therapy: WFL for tasks assessed/performed Overall Cognitive Status: Difficult to assess                                 General Comments: Answers questions appropraitely. difficult to fully assess 2/2 very HOH and language barrier.      Exercises      General Comments General comments (skin integrity, edema, etc.): Son present throughout session. In-person interpreter, Charlesetta Shanks present during session      Pertinent Vitals/Pain Pain Assessment: Faces Faces Pain Scale: Hurts a little bit Pain Location: abdominal pain Pain Descriptors / Indicators: Grimacing;Discomfort Pain Intervention(s): Monitored during session;Repositioned    Home Living                      Prior Function            PT Goals (current goals can now be found in the care plan section) Acute Rehab PT Goals Patient Stated Goal: not stated    Frequency    Min 3X/week      PT Plan      Co-evaluation  AM-PAC PT "6 Clicks" Mobility   Outcome Measure  Help needed turning from your back to your side while in a flat bed without using bedrails?: A Little Help needed moving from lying on your back to sitting on the side of a flat bed without using bedrails?: A Little Help needed moving to and from a bed to a chair (including a wheelchair)?: A Little Help needed standing up from a chair using your arms (e.g., wheelchair or bedside chair)?: A Little Help needed to walk in hospital room?: A Little Help needed climbing 3-5 steps with a railing? : A Little 6 Click Score: 18    End of Session Equipment Utilized During Treatment: Gait belt Activity Tolerance: Patient tolerated treatment  well Patient left: with call bell/phone within reach;in chair Nurse Communication: Mobility status PT Visit Diagnosis: Unsteadiness on feet (R26.81);Muscle weakness (generalized) (M62.81)     Time:  -     Charges:                        Rosita Kea, SPT

## 2021-03-06 NOTE — Consult Note (Addendum)
Cardiology Consultation:   Patient ID: Glen Roy MRN: [id number]; DOB: January 07, 1941  Admit date: 03/03/2021 Date of Consult: 03/06/2021  PCP:  Aviva Kluver   [facility]  Cardiologist:  New Advanced Practice Provider:  No care team member to display Electrophysiologist:  None  [id number]}   Patient Profile:   Glen Roy is a 80 y.o. male with a hx of no significant pmh who is being seen today for the evaluation of heart failure and new afib at the request of Dr. [place].  History of Present Illness:   Glen Roy recently moved from [place] to [place]. He plans on staying here. No significant past cardiac history. No h/o tobacco or drug use. He reports drinking a ?shot of alcohol daily.   The patient presented to the ER for abdominal pain, constipation and decreased appetite. Also had nausea and vomiting. Feels generally weak and had some lightheaded. No recent BM. No fever, chills. No chest pain, SOB.   In the ER he remained hemodynamically stable. Labs showed sodium 114, creatinine 0.93, BUN 10, WBC 4.4, Hgb 11.4, platelets 122. EKG showed rate controlled Afib. CT abd/pelvis showed no acute abnormality, trace free fluid, aortic atherosclerosis. The patient was started on IVF and admitted for further work-up.    Past Medical History:  Diagnosis Date  . Gastritis     History reviewed. No pertinent surgical history.   Home Medications:  Prior to Admission medications   Medication Sig Start Date End Date Taking? Authorizing Provider  Ferrous Sulfate (IRON PO) Take 1 capsule by mouth every morning.   Yes [provider]  Multiple Vitamin (MULTIVITAMIN ADULT) TABS Take 1 tablet by mouth daily.   Yes [provider]  sodium chloride (OCEAN) 0.65 % SOLN nasal spray Place 1 spray into both nostrils as needed for congestion.   Yes [provider]    Inpatient Medications: Scheduled Meds: . enoxaparin (LOVENOX)  injection  1 mg/kg Subcutaneous Q12H  . fluticasone  2 spray Each Nare Daily  . [START ON 03/08/2021] linaclotide  145 mcg Oral QODAY  . loratadine  10 mg Oral Daily  . losartan  25 mg Oral Daily  . metoprolol tartrate  12.5 mg Oral BID   Continuous Infusions: . 0.9 % NaCl with KCl 20 mEq / L 125 mL/hr at 03/06/21 1306  . promethazine (PHENERGAN) injection (IM or IVPB)     PRN Meds: acetaminophen **OR** acetaminophen, polyethylene glycol, promethazine (PHENERGAN) injection (IM or IVPB), sodium chloride  Allergies:   No Known Allergies  Social History:   Social History   Socioeconomic History  . Marital status: Married    Spouse name: Not on file  . Number of children: Not on file  . Years of education: Not on file  . Highest education level: Not on file  Occupational History  . Not on file  Tobacco Use  . Smoking status: Never Smoker  . Smokeless tobacco: Never Used  Substance and Sexual Activity  . Alcohol use: Not Currently  . Drug use: Never  . Sexual activity: Not on file  Other Topics Concern  . Not on file  Social History Narrative  . Not on file   Social Determinants of Health   Financial Resource Strain: Not on file  Food Insecurity: Not on file  Transportation Needs: Not on file  Physical Activity: Not on file  Stress: Not on file  Social Connections: Not on file  Intimate Partner Violence: Not on file  Family History:    Family History  Family history unknown: Yes     ROS:  Please see the history of present illness.   All other ROS reviewed and negative.     Physical Exam/Data:   Vitals:   03/05/21 1700 03/05/21 2107 03/06/21 0415 03/06/21 0914  BP: (!) 150/90 129/77 116/70 (!) 164/97  Pulse: 90 93 71 99  Resp: 17 18 18 17   Temp: 98 F (36.7 C) 98.1 F (36.7 C) 98.4 F (36.9 C)   TempSrc: Oral Oral Oral   SpO2: 98% 98% 92% (!) 89%  Weight:  63.5 kg      Intake/Output Summary (Last 24 hours) at 03/06/2021 1323 Last data filed at  03/06/2021 0800 Gross per 24 hour  Intake 2906.12 ml  Output 650 ml  Net 2256.12 ml   Last 3 Weights 03/05/2021 03/04/2021 03/03/2021  Weight (lbs) 139 lb 15.9 oz 160 lb 15 oz 161 lb 2.5 oz  Weight (kg) 63.5 kg 73 kg 73.1 kg     There is no height or weight on file to calculate BMI.  General:  Frail elderly male, pal HEENT: normal Lymph: no adenopathy Neck: no JVD Endocrine:  No thryomegaly Vascular: No carotid bruits; FA pulses 2+ bilaterally without bruits  Cardiac:  normal S1, S2; Irreg Irreg; no murmur  Lungs:  clear to auscultation bilaterally, no wheezing, rhonchi or rales  Abd: soft, nontender, no hepatomegaly  Ext: no edema Musculoskeletal:  No deformities, BUE and BLE strength normal and equal Skin: warm and dry  Neuro:  CNs 2-12 intact, no focal abnormalities noted Psych:  Normal affect   EKG:  The EKG was personally reviewed and demonstrates:  Afib, 78bpm, RBBB, LPFB Telemetry:  Telemetry was personally reviewed and demonstrates:  Afib with rates 60-70s with some elevation up to 120s  Relevant CV Studies:  1. Left ventricular ejection fraction, by estimation, is 35 to 40%. The  left ventricle has moderately decreased function. The left ventricle  demonstrates global hypokinesis. Left ventricular diastolic function could  not be evaluated.  2. Right ventricular systolic function is mildly reduced. The right  ventricular size is normal. There is normal pulmonary artery systolic  pressure. The estimated right ventricular systolic pressure is 30.7 mmHg.  3. Left atrial size was mildly dilated.  4. Right atrial size was mildly dilated.  5. The mitral valve is grossly normal. Mild to moderate mitral valve  regurgitation. No evidence of mitral stenosis.  6. The aortic valve is tricuspid. Aortic valve regurgitation is mild. No  aortic stenosis is present.  7. The inferior vena cava is normal in size with greater than 50%  respiratory variability, suggesting right  atrial pressure of 3 mmHg.     Laboratory Data:  High Sensitivity Troponin:   Recent Labs  Lab 03/03/21 2045 03/03/21 2136  TROPONINIHS 9 10     Chemistry Recent Labs  Lab 03/05/21 0510 03/05/21 1157 03/06/21 0604  NA 118* 119* 120*  K 3.6 3.5 4.2  CL 89* 90* 93*  CO2 20* 24 22  GLUCOSE 71 107* 70  BUN 7* 7* <5*  CREATININE 0.83 0.89 0.86  CALCIUM 7.1* 7.4* 7.3*  GFRNONAA >60 >60 >60  ANIONGAP 9 5 5     Recent Labs  Lab 03/03/21 1653  PROT 6.5  ALBUMIN 3.5  AST 61*  ALT 20  ALKPHOS 45  BILITOT 0.7   Hematology Recent Labs  Lab 03/03/21 1653 03/03/21 2136 03/04/21 0549  WBC 4.4 4.4 3.4*  RBC 3.58* RESULTS UNAVAILABLE DUE TO INTERFERING SUBSTANCE 3.18*  HGB 11.4* 11.4* 10.2*  HCT 30.4* RESULTS UNAVAILABLE DUE TO INTERFERING SUBSTANCE 27.2*  MCV 84.9 RESULTS UNAVAILABLE DUE TO INTERFERING SUBSTANCE 85.5  MCH 31.8 RESULTS UNAVAILABLE DUE TO INTERFERING SUBSTANCE 32.1  MCHC 37.5* RESULTS UNAVAILABLE DUE TO INTERFERING SUBSTANCE 37.5*  RDW 11.7 RESULTS UNAVAILABLE DUE TO INTERFERING SUBSTANCE 11.8  PLT 122* 117* 74*   BNP Recent Labs  Lab 03/06/21 0604  BNP 402.7*    DDimer No results for input(s): DDIMER in the last 168 hours.   Radiology/Studies:  DG Abd 1 View  Result Date: 03/03/2021 CLINICAL DATA:  Lower abdominal pain, constipation. EXAM: ABDOMEN - 1 VIEW COMPARISON:  None. FINDINGS: The bowel gas pattern is normal. No radio-opaque calculi or other significant radiographic abnormality are seen. IMPRESSION: Negative. Electronically Signed   By: Lupita Raider M.D.   On: 03/03/2021 14:50   CT ABDOMEN PELVIS W CONTRAST  Result Date: 03/03/2021 CLINICAL DATA:  Abdominal pain with vomiting and diarrhea. EXAM: CT ABDOMEN AND PELVIS WITH CONTRAST TECHNIQUE: Multidetector CT imaging of the abdomen and pelvis was performed using the standard protocol following bolus administration of intravenous contrast. CONTRAST:  OMNIPAQUE IOHEXOL 300 MG/ML   SOLN COMPARISON:  Radiograph earlier today. FINDINGS: Lower chest: Mild cardiomegaly. Mild right lower lobe atelectasis. No confluent consolidation or pleural effusion. Hepatobiliary: Streak artifact from arms down positioning as well as overlying monitoring device. No focal liver lesion is seen. Unremarkable gallbladder. No calcified gallstone or pericholecystic fat stranding. Pancreas: No ductal dilatation or inflammation. Spleen: Normal in size without focal abnormality. Adrenals/Urinary Tract: No adrenal nodule. No hydronephrosis. Bilateral renal parenchymal thinning. Parapelvic cysts in the left kidney. No suspicious renal lesion. No renal stone. Partially distended urinary bladder, unremarkable. Stomach/Bowel: Bowel evaluation is limited in the absence of enteric contrast, streak artifact from arms down positioning and paucity of intra-abdominal fat. Stomach is grossly normal. There is no bowel dilatation to suggest obstruction. Few fluid-filled loops of small bowel in the pelvis are nondilated or inflamed. Appendix tentatively visualized and normal, though not well evaluated. Air and stool throughout the colon without colonic wall thickening or inflammatory change. Vascular/Lymphatic: Aortic atherosclerosis and tortuosity. No aneurysm. No bulky abdominopelvic adenopathy. Reproductive: Prostate gland not well seen, may be diminutive or absent. Other: Trace free fluid in the pelvis. No free air. No abdominal wall hernia. Musculoskeletal: Mild T12 and L2 superior endplate compression fractures. Bones are diffusely under mineralized. Sclerotic focus in the right superior pubic ramus. No acute osseous abnormalities. IMPRESSION: 1. No acute abnormality in the abdomen/pelvis. No explanation for symptoms. 2. Trace free fluid in the pelvis is nonspecific, but may be reactive. 3. Mild T12 and L2 superior endplate compression fractures, age indeterminate. Aortic Atherosclerosis (ICD10-I70.0). Electronically Signed   By:  Narda Rutherford M.D.   On: 03/03/2021 19:40   ECHOCARDIOGRAM COMPLETE  Result Date: 03/05/2021    ECHOCARDIOGRAM REPORT   Patient Name:   Glen Roy Date of Exam: 03/05/2021 Medical Rec #:  [id number]              Height: Accession #:    [id number]             Weight:       160.9 lb Date of Birth:  1941-08-23              BSA:          1.747 m Patient Age:    70 years  BP:           148/78 mmHg Patient Gender: M                      HR:           70 bpm. Exam Location:  Inpatient Procedure: 2D Echo, Cardiac Doppler and Color Doppler Indications:    Atrial Fibrillation  History:        Patient has no prior history of Echocardiogram examinations.  Sonographer:    Shirlean Kelly Referring Phys: [id number] ERIC J [place] IMPRESSIONS  1. Left ventricular ejection fraction, by estimation, is 35 to 40%. The left ventricle has moderately decreased function. The left ventricle demonstrates global hypokinesis. Left ventricular diastolic function could not be evaluated.  2. Right ventricular systolic function is mildly reduced. The right ventricular size is normal. There is normal pulmonary artery systolic pressure. The estimated right ventricular systolic pressure is 30.7 mmHg.  3. Left atrial size was mildly dilated.  4. Right atrial size was mildly dilated.  5. The mitral valve is grossly normal. Mild to moderate mitral valve regurgitation. No evidence of mitral stenosis.  6. The aortic valve is tricuspid. Aortic valve regurgitation is mild. No aortic stenosis is present.  7. The inferior vena cava is normal in size with greater than 50% respiratory variability, suggesting right atrial pressure of 3 mmHg. FINDINGS  Left Ventricle: Left ventricular ejection fraction, by estimation, is 35 to 40%. The left ventricle has moderately decreased function. The left ventricle demonstrates global hypokinesis. The left ventricular internal cavity size was normal in size. There is no left ventricular  hypertrophy. Left ventricular diastolic function could not be evaluated due to atrial fibrillation. Left ventricular diastolic function could not be evaluated. Right Ventricle: The right ventricular size is normal. No increase in right ventricular wall thickness. Right ventricular systolic function is mildly reduced. There is normal pulmonary artery systolic pressure. The tricuspid regurgitant velocity is 2.63 m/s, and with an assumed right atrial pressure of 3 mmHg, the estimated right ventricular systolic pressure is 30.7 mmHg. Left Atrium: Left atrial size was mildly dilated. Right Atrium: Right atrial size was mildly dilated. Pericardium: Trivial pericardial effusion is present. Mitral Valve: The mitral valve is grossly normal. Mild to moderate mitral valve regurgitation. No evidence of mitral valve stenosis. Tricuspid Valve: The tricuspid valve is grossly normal. Tricuspid valve regurgitation is mild . No evidence of tricuspid stenosis. Aortic Valve: The aortic valve is tricuspid. Aortic valve regurgitation is mild. No aortic stenosis is present. Aortic valve mean gradient measures 2.0 mmHg. Aortic valve peak gradient measures 3.6 mmHg. Aortic valve area, by VTI measures 2.08 cm. Pulmonic Valve: The pulmonic valve was grossly normal. Pulmonic valve regurgitation is not visualized. No evidence of pulmonic stenosis. Aorta: The aortic root and ascending aorta are structurally normal, with no evidence of dilitation. Venous: The inferior vena cava is normal in size with greater than 50% respiratory variability, suggesting right atrial pressure of 3 mmHg. IAS/Shunts: The atrial septum is grossly normal.  LEFT VENTRICLE PLAX 2D LVIDd:         4.90 cm LVIDs:         3.50 cm LV PW:         0.90 cm LV IVS:        0.80 cm LVOT diam:     2.00 cm LV SV:         38 LV SV Index:   22 LVOT Area:  3.14 cm  LV Volumes (MOD) LV vol d, MOD A2C: 87.5 ml LV vol d, MOD A4C: 85.9 ml LV vol s, MOD A2C: 52.5 ml LV vol s, MOD A4C:  51.0 ml LV SV MOD A2C:     35.0 ml LV SV MOD A4C:     85.9 ml LV SV MOD BP:      34.5 ml RIGHT VENTRICLE            IVC RV Basal diam:  3.00 cm    IVC diam: 1.50 cm RV S prime:     8.92 cm/s LEFT ATRIUM             Index       RIGHT ATRIUM           Index LA diam:        3.70 cm 2.12 cm/m  RA Area:     29.20 cm LA Vol (A2C):   91.6 ml 52.43 ml/m RA Volume:   105.50 ml 60.39 ml/m LA Vol (A4C):   93.0 ml 53.24 ml/m LA Biplane Vol: 97.8 ml 55.98 ml/m  AORTIC VALVE AV Area (Vmax):    2.29 cm AV Area (Vmean):   2.00 cm AV Area (VTI):     2.08 cm AV Vmax:           95.30 cm/s AV Vmean:          73.600 cm/s AV VTI:            0.181 m AV Peak Grad:      3.6 mmHg AV Mean Grad:      2.0 mmHg LVOT Vmax:         69.50 cm/s LVOT Vmean:        46.800 cm/s LVOT VTI:          0.120 m LVOT/AV VTI ratio: 0.66  AORTA Ao Root diam: 3.30 cm Ao Asc diam:  3.40 cm MITRAL VALVE               TRICUSPID VALVE MV Area (PHT): 4.31 cm    TR Peak grad:   27.7 mmHg MV Decel Time: 176 msec    TR Vmax:        263.00 cm/s MV E velocity: 98.50 cm/s                            SHUNTS                            Systemic VTI:  0.12 m                            Systemic Diam: 2.00 cm Lennie Odor MD Electronically signed by Lennie Odor MD Signature Date/Time: 03/05/2021/3:29:28 PM    Final      Assessment and Plan:   Paroxysmal Afib - presented with abdominal pain, nausea, vomiting found to have hyponatremia and afib - No known history of afib - EKG shows rate controlled Afib, 78 bpm, RBBB, LPFB - Echo LVEF 35-40%, global HK, mildly dilated LA, mildly RA, mild to mod MD, mild AI . He will eventually need ischemic evaluation - CHADSVASC at least 5 (agex2, CHF, PAD, HTN). He would qualify for longterm anticoagulation - therapeutic lovenox. 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  Was fluid resuscitated, sodium is improving.  Nephrology on board.  Ejection fraction on echocardiogram was 35%.  He denies any orthopnea no PND no shortness of breath.  No prior history of tobacco use.  Challenging to obtain a full history.  On exam, laying comfortably in bed normal respiratory effort heart is irregularly irregular but normal rate lungs are clear.  No edema.  Elderly male.  Serum sodium currently 120 up from 115, creatinine 0.86 potassium 4.2 LDL 108 BNP 402.  Assessment and plan:  Paroxysmal atrial fibrillation - Newly discovered - We will place on Eliquis 5 mg twice a day.  Currently rate controlled.  Continue with Lopressor.  Chronic anticoagulation - Eliquis new start.  Watch for any signs of bleeding. -No need for cardioversion.  Well rate controlled.  Newly discovered cardiomyopathy 35 to 40% EF - Unclear etiology.  At some point it may be helpful to exclude ischemia however now we will continue to treat with medications and repeat echocardiogram in the future.  Agree with angiotensin receptor blocker start as well as beta-blocker start.   Asymptomatic at this time.  Aortic atherosclerosis - Would recommend statin therapy.  Donato Schultz, MD

## 2021-03-07 ENCOUNTER — Inpatient Hospital Stay (HOSPITAL_COMMUNITY): Payer: Self-pay

## 2021-03-07 LAB — BASIC METABOLIC PANEL
Anion gap: 7 (ref 5–15)
BUN: 5 mg/dL — ABNORMAL LOW (ref 8–23)
CO2: 19 mmol/L — ABNORMAL LOW (ref 22–32)
Calcium: 7.7 mg/dL — ABNORMAL LOW (ref 8.9–10.3)
Chloride: 95 mmol/L — ABNORMAL LOW (ref 98–111)
Creatinine, Ser: 0.9 mg/dL (ref 0.61–1.24)
GFR, Estimated: >60 mL/min (ref >60)
Glucose, Bld: 72 mg/dL (ref 70–99)
Potassium: 4.5 mmol/L (ref 3.5–5.1)
Sodium: 121 mmol/L — ABNORMAL LOW (ref 135–145)

## 2021-03-07 LAB — MAGNESIUM: Magnesium: 1.6 mg/dL — ABNORMAL LOW (ref 1.7–2.4)

## 2021-03-07 MED ORDER — FUROSEMIDE 10 MG/ML IJ SOLN
40.0000 mg | Freq: Once | INTRAMUSCULAR | Status: AC
  Administered 2021-03-07: 40 mg via INTRAVENOUS
  Filled 2021-03-07: qty 4

## 2021-03-07 MED ORDER — MAGNESIUM SULFATE 2 GM/50ML IV SOLN
2.0000 g | Freq: Once | INTRAVENOUS | Status: AC
  Administered 2021-03-07: 2 g via INTRAVENOUS
  Filled 2021-03-07: qty 50

## 2021-03-07 MED ORDER — LOSARTAN POTASSIUM 50 MG PO TABS
50.0000 mg | ORAL_TABLET | Freq: Every day | ORAL | Status: DC
  Administered 2021-03-08 – 2021-03-10 (×3): 50 mg via ORAL
  Filled 2021-03-07 (×3): qty 1

## 2021-03-07 MED ORDER — METOPROLOL TARTRATE 25 MG PO TABS
25.0000 mg | ORAL_TABLET | Freq: Two times a day (BID) | ORAL | Status: DC
  Administered 2021-03-07 – 2021-03-11 (×8): 25 mg via ORAL
  Filled 2021-03-07 (×8): qty 1

## 2021-03-07 NOTE — Progress Notes (Signed)
PROGRESS NOTE    Glen Roy  WUJ:[id number] DOB: [birth year] DOA: 03/03/2021 PCP: Pcp, No    Brief Narrative:  Glen Roy is a 80 year old male with no significant past medical history who presented to the ED on 4/26 with nausea/vomiting, abdominal pain and decreased appetite over the past 4 days.  Additionally, patient reported generalized weakness and overall feeling ill.  Patient reports feeling lightheaded when he stands up with generalized fatigue with any type of activity.  No bowel movement since Thursday.  Recently moved from [place] to [place] to be with family.  No change in dietary habits, no sick contacts.  Denies any raw or undercooked meat ingestion.  Denies headache, no chest pain, no palpitations, no shortness of breath, no cough.  Also denies tobacco, EtOH, illicit drug use.  In the ED, temperature 98.2 F, HR 74, RR 16, BP 140/69, SPO2 100% on room air.  Sodium 114, potassium 3.5, chloride 82, CO2 24, glucose 77, BUN 10, creatinine 0.93, AST 61, ALT 20, total bilirubin 0.7.  Lipase 27.  WBC 4.4, hemoglobin 11.4, platelets 122.  SARS-CoV-2 PCR negative.  Influenza A/B PCR negative.  Urinalysis unrevealing.  X-ray abdomen unrevealing.  CT abdomen/pelvis with contrast with no acute abnormality in the abdomen/pelvis, trace free fluid in the pelvis nonspecific, mild T12 and L2 superior endplate compression fracture and age-indeterminate.   Assessment & Plan:   Principal Problem:   Hyponatremia Active Problems:   Generalized weakness   Constipation   Nausea & vomiting   Prolonged QT interval   AF (paroxysmal atrial fibrillation) (HCC)   Malnutrition of moderate degree   Severe hyponatremia Patient presenting to the ED with progressive weakness, nausea/vomiting and associated abdominal pain.  Poor oral intake in the preceding days.  Patient was found to have a sodium of 114 on admission.  Denies EtOH use.  Serum osmolality low at 242.  Urine  osmolality low at 241, urine sodium less than 10.  Suspect hypovolemic hyponatremia from extrarenal loss secondary to nausea/vomiting and poor oral intake. --Nephrology following, appreciate assistance --Na 114>>117>>119>120>121 --Discontinue IV fluids due to volume overload overnight --Sodium chloride tablets 1 g p.o. 3 times daily --BMP daily --supportive care  Irritable bowel syndrome with constipation Abdominal x-ray and CT abdomen/pelvis with contrast unrevealing.  Suspect viral illness as patient is afebrile without leukocytosis.  No diarrhea.  No sick contacts.  Patient's son reports that patient used to take regularly medication from [place] called Brumurro de pinaverioo, which is an antispasmodic. --Discontinued Linzess due to multiple bowel movements --Bentyl as needed --Supportive care, antiemetics --advance diet today  Paroxysmal atrial fibrillation, new diagnosis On admission, EKG notable for atrial fibrillation.  Denies past history of arrhythmia.  Currently rate controlled, borderline bradycardic.  --Started on Lovenox treatment dose; SW for benefit check for NOAC --Continue to monitor on telemetry  Systolic congestive heart failure, new diagnosis Cardiomyopathy, unclear etiology TTE with LVEF 35-40%, LV moderately decreased function with global hypokinesis, RV systolic function mildly reduced, LA/RA mildly dilated, mild/moderate MR, IVC normal in size.  BNP 402.7. --Cardiology following, appreciate assistance --Metoprolol tartrate 50 mg p.o. twice daily --Losartan 50 mg p.o. daily --Furosemide 40 mg IV x1 today --Cardiology deferring ischemic evaluation at this time  Aortic atherosclerosis Incidentally noted on CT abdomen/pelvis.  Total cholesterol 156, HDL 42, LDL 108.  Weakness, deconditioning, gait disturbance --PT/OT recommends home health PT with rolling walker --Continue PT/OT efforts while inpatient   DVT prophylaxis: Lovenox   Code Status: Full  Code Family  Communication: Updated patient's son who is present at bedside with aid of video translator.  Disposition Plan:  Level of care: Telemetry Medical Status is: Inpatient  Remains inpatient appropriate because:Ongoing diagnostic testing needed not appropriate for outpatient work up, Unsafe d/c plan, IV treatments appropriate due to intensity of illness or inability to take PO and Inpatient level of care appropriate due to severity of illness   Dispo: The patient is from: Home              Anticipated d/c is to: Home              Patient currently is not medically stable to d/c.   Difficult to place patient No   Consultants:   Cardiology  Nephrology, Dr. Ronalee BeltsBhandari  Procedures:   TTE  Antimicrobials:   None   Subjective: Patient seen and examined at bedside, resting comfortably.  Patient sleeping but arousable but appears very tired.  Son present at bedside.  Aided with video translator, [name]  [phone].  Was able to tolerate increased oral intake yesterday, but did not eat much for breakfast this morning.  Abdominal pain and bloating continues to be resolved.  Overnight, patient appeared to be more confused and short of breath.  Chest x-ray with cardiomegaly and pulmonary edema, CT head with no acute intracranial finding.  Stopped IV fluids.  Sodium continues to very slowly improved to 121 this morning.  Discussed with patient and son with use of translator that patient remains very debilitated due to his medical conditions of heart failure, atrial fibrillation and hyponatremia with poor oral intake.  No other questions or concerns at this time.  Denies headache, no visual changes, no chest pain, no palpitations, no abdominal pain. No other acute concerns overnight per nursing staff.  Objective: Vitals:   03/06/21 2048 03/06/21 2053 03/07/21 0203 03/07/21 0636  BP: 135/89  (!) 145/68 (!) 161/97  Pulse: 86 89 76 (!) 101  Resp: 18 18 17 19   Temp: 98.7 F (37.1 C)  99.1 F  (37.3 C) (!) 97.3 F (36.3 C)  TempSrc: Oral  Oral Oral  SpO2: (!) 84% 93% 95% 96%  Weight:        Intake/Output Summary (Last 24 hours) at 03/07/2021 1506 Last data filed at 03/07/2021 0600 Gross per 24 hour  Intake 1454.36 ml  Output 0 ml  Net 1454.36 ml   Filed Weights   03/03/21 2145 03/04/21 2122 03/05/21 2107  Weight: 73.1 kg 73 kg 63.5 kg    Examination:  General exam: Appears calm and comfortable, weak/debilitated, chronically ill in appearance HEENT: Dried crusted blood noted around gums/mouth Respiratory system: Decreased breath sounds bilateral bases with crackles, normal respiratory effort, on 3 L nasal cannula with SPO2 96% Cardiovascular system: S1 & S2 heard, irregularly irregular rhythm, normal rate. No JVD, murmurs, rubs, gallops or clicks. No pedal edema. Gastrointestinal system: Abdomen is nondistended, soft and nontender. No organomegaly or masses felt. Normal bowel sounds heard. Central nervous system: Alert and oriented. No focal neurological deficits. Extremities: Symmetric 5 x 5 power. Skin: No rashes, lesions or ulcers Psychiatry: Judgement and insight appear poor. Mood & affect appropriate.     Data Reviewed: I have personally reviewed following labs and imaging studies  CBC: Recent Labs  Lab 03/03/21 1653 03/04/21 0549  WBC 4.4 3.4*  NEUTROABS 2.5  --   HGB 11.4* 10.2*  HCT 30.4* 27.2*  MCV 84.9 85.5  PLT 122* 74*   Basic Metabolic Panel: Recent Labs  Lab 03/04/21 2230 03/05/21 0510 03/05/21 1157 03/06/21 0604 03/06/21 1456 03/07/21 0355  NA 115* 118* 119* 120* 120* 121*  K 3.3* 3.6 3.5 4.2  --  4.5  CL 88* 89* 90* 93*  --  95*  CO2 22 20* 24 22  --  19*  GLUCOSE 85 71 107* 70  --  72  BUN 9 7* 7* <5*  --  5*  CREATININE 0.85 0.83 0.89 0.86  --  0.90  CALCIUM 7.0* 7.1* 7.4* 7.3*  --  7.7*  MG  --   --   --  1.3*  --  1.6*   GFR: CrCl cannot be calculated (Unknown ideal weight.). Liver Function Tests: Recent Labs  Lab  03/03/21 1653  AST 61*  ALT 20  ALKPHOS 45  BILITOT 0.7  PROT 6.5  ALBUMIN 3.5   Recent Labs  Lab 03/03/21 1653  LIPASE 27   No results for input(s): AMMONIA in the last 168 hours. Coagulation Profile: No results for input(s): INR, PROTIME in the last 168 hours. Cardiac Enzymes: No results for input(s): CKTOTAL, CKMB, CKMBINDEX, TROPONINI in the last 168 hours. BNP (last 3 results) No results for input(s): PROBNP in the last 8760 hours. HbA1C: Recent Labs    03/06/21 1456  HGBA1C 5.1   CBG: No results for input(s): GLUCAP in the last 168 hours. Lipid Profile: Recent Labs    03/06/21 0604  CHOL 156  HDL 42  LDLCALC 108*  TRIG 29  CHOLHDL 3.7   Thyroid Function Tests: No results for input(s): TSH, T4TOTAL, FREET4, T3FREE, THYROIDAB in the last 72 hours. Anemia Panel: No results for input(s): VITAMINB12, FOLATE, FERRITIN, TIBC, IRON, RETICCTPCT in the last 72 hours. Sepsis Labs: No results for input(s): PROCALCITON, LATICACIDVEN in the last 168 hours.  Recent Results (from the past 240 hour(s))  Resp Panel by RT-PCR (Flu A&B, Covid) Nasopharyngeal Swab     Status: None   Collection Time: 03/03/21  8:20 PM   Specimen: Nasopharyngeal Swab; Nasopharyngeal(NP) swabs in vial transport medium  Result Value Ref Range Status   SARS Coronavirus 2 by RT PCR NEGATIVE NEGATIVE Final    Comment: (NOTE) SARS-CoV-2 target nucleic acids are NOT DETECTED.  The SARS-CoV-2 RNA is generally detectable in upper respiratory specimens during the acute phase of infection. The lowest concentration of SARS-CoV-2 viral copies this assay can detect is 138 copies/mL. A negative result does not preclude SARS-Cov-2 infection and should not be used as the sole basis for treatment or other patient management decisions. A negative result may occur with  improper specimen collection/handling, submission of specimen other than nasopharyngeal swab, presence of viral mutation(s) within  the areas targeted by this assay, and inadequate number of viral copies(<138 copies/mL). A negative result must be combined with clinical observations, patient history, and epidemiological information. The expected result is Negative.  Fact Sheet for Patients:  BloggerCourse.com  Fact Sheet for Healthcare Providers:  SeriousBroker.it  This test is no t yet approved or cleared by the [place] FDA and  has been authorized for detection and/or diagnosis of SARS-CoV-2 by FDA under an Emergency Use Authorization (EUA). This EUA will remain  in effect (meaning this test can be used) for the duration of the COVID-19 declaration under Section 564(b)(1) of the Act, 21 U.S.C.section 360bbb-3(b)(1), unless the authorization is terminated  or revoked sooner.       Influenza A by PCR NEGATIVE NEGATIVE Final   Influenza B by PCR NEGATIVE NEGATIVE Final  Comment: (NOTE) The Xpert Xpress SARS-CoV-2/FLU/RSV plus assay is intended as an aid in the diagnosis of influenza from Nasopharyngeal swab specimens and should not be used as a sole basis for treatment. Nasal washings and aspirates are unacceptable for Xpert Xpress SARS-CoV-2/FLU/RSV testing.  Fact Sheet for Patients: BloggerCourse.com  Fact Sheet for Healthcare Providers: SeriousBroker.it  This test is not yet approved or cleared by the [place] FDA and has been authorized for detection and/or diagnosis of SARS-CoV-2 by FDA under an Emergency Use Authorization (EUA). This EUA will remain in effect (meaning this test can be used) for the duration of the COVID-19 declaration under Section 564(b)(1) of the Act, 21 U.S.C. section 360bbb-3(b)(1), unless the authorization is terminated or revoked.  Performed at [facility], [address] [place], [place] [address]          Radiology Studies: CT HEAD WO  CONTRAST  Result Date: 03/07/2021 CLINICAL DATA:  Initial evaluation for acute mental status change. EXAM: CT HEAD WITHOUT CONTRAST TECHNIQUE: Contiguous axial images were obtained from the base of the skull through the vertex without intravenous contrast. COMPARISON:  None available. FINDINGS: Brain: Generalized age-related cerebral atrophy with mild chronic small vessel ischemic disease. No acute intracranial hemorrhage. No visible acute large vessel territory infarct. No mass lesion, midline shift or mass effect. No hydrocephalus. No extra-axial fluid collection. Vascular: There is question of asymmetric hyperdensity versus artifact involving a proximal right M2 branch (series 6, image 21). While this is favored to be artifactual, possible thrombus could conceivably have this appearance as well. No other hyperdense vessel. Skull: Scalp soft tissues within normal limits.  Calvarium intact. Sinuses/Orbits: Globes and orbital soft tissues within normal limits. Visualized paranasal sinuses are largely clear. No significant mastoid effusion. Other: None. IMPRESSION: 1. Question asymmetric hyperdensity involving a proximal right M2 branch vessel as above. While this is favored to be artifactual, possible thrombus could also have this appearance. If there is clinical concern for possible developing right MCA territory ischemia, then further evaluation with dedicated CTA would be recommended for further evaluation. 2. No other acute intracranial abnormality. No visible evidence for acute ischemia on this exam. 3. Generalized age-related cerebral atrophy with mild chronic small vessel ischemic disease. Electronically Signed   By: Rise Mu M.D.   On: 03/07/2021 03:42   DG CHEST PORT 1 VIEW  Result Date: 03/06/2021 CLINICAL DATA:  Hypoxia. EXAM: PORTABLE CHEST 1 VIEW COMPARISON:  None. FINDINGS: There is cardiomegaly. There are prominent interstitial lung markings which appear to be new since the recent  CT. There is suggestion of a new small right-sided pleural effusion. No focal infiltrate. No pneumothorax. No definite acute osseous abnormality. IMPRESSION: Cardiomegaly with findings concerning for developing volume overload with a small right-sided pleural effusion which is new from prior CT. Electronically Signed   By: Katherine Mantle M.D.   On: 03/06/2021 22:49        Scheduled Meds: . apixaban  5 mg Oral BID  . atorvastatin  20 mg Oral Daily  . feeding supplement  237 mL Oral BID BM  . fluticasone  2 spray Each Nare Daily  . loratadine  10 mg Oral Daily  . [START ON 03/08/2021] losartan  50 mg Oral Daily  . metoprolol tartrate  25 mg Oral BID  . sodium chloride  1 g Oral TID WC   Continuous Infusions: . promethazine (PHENERGAN) injection (IM or IVPB)       LOS: 4 days    Time  spent: 40 minutes spent on chart review, discussion with nursing staff, consultants, updating family and interview/physical exam; more than 50% of that time was spent in counseling and/or coordination of care.    Alvira Philips [place], DO [facility] Available via Epic secure chat 7am-7pm After these hours, please refer to coverage provider listed on [email or web address] 03/07/2021, 3:06 PM

## 2021-03-07 NOTE — Progress Notes (Signed)
   03/07/21 1600  Clinical Encounter Type  Visited With Family  Visit Type Initial;Spiritual support  Referral From Nurse  Consult/Referral To Chaplain  Spiritual Encounters  Spiritual Needs  (Catholic [place])   Chaplain responded to consult for Catholic prayer. Chaplain spoke with Pt's nurse, Marge, for more context and information. Chaplain then spoke with Pt's sons at bedside via virtual translator to confirm specific needs and timeframe. The Pt is not local, so this Chaplain left a voicemail with Fr Mordecai Maes at [facility] in [place]. Chaplain will pass on this information to the next on-call chaplain in case Fr. Mordecai Maes calls back before Monday.    This note was prepared by Chaplain Resident, Tacy Learn, MDiv. Chaplain remains available as needed through the on-call pager: [phone].

## 2021-03-07 NOTE — Progress Notes (Addendum)
Progress Note  Patient Name: Glen Roy Date of Encounter: 03/07/2021  [facility] Cardiologist: No primary care provider on file. New Glen Roy  Subjective   No new complaints.  Quite sleepy.  Here with his 2 family members.  Used translator  Inpatient Medications    Scheduled Meds: . apixaban  5 mg Oral BID  . atorvastatin  20 mg Oral Daily  . feeding supplement  237 mL Oral BID BM  . fluticasone  2 spray Each Nare Daily  . furosemide  40 mg Intravenous Once  . loratadine  10 mg Oral Daily  . losartan  25 mg Oral Daily  . metoprolol tartrate  12.5 mg Oral BID  . sodium chloride  1 g Oral TID WC   Continuous Infusions: . magnesium sulfate bolus IVPB 2 g (03/07/21 1244)  . promethazine (PHENERGAN) injection (IM or IVPB)     PRN Meds: acetaminophen **OR** acetaminophen, dicyclomine, polyethylene glycol, promethazine (PHENERGAN) injection (IM or IVPB), sodium chloride   Vital Signs    Vitals:   03/06/21 2048 03/06/21 2053 03/07/21 0203 03/07/21 0636  BP: 135/89  (!) 145/68 (!) 161/97  Pulse: 86 89 76 (!) 101  Resp: Temp: 98.7 F (37.1 C)  99.1 F (37.3 C) (!) 97.3 F (36.3 C)  TempSrc: Oral  Oral Oral  SpO2: (!) 84% 93% 95% 96%  Weight:        Intake/Output Summary (Last 24 hours) at 03/07/2021 1248 Last data filed at 03/07/2021 0600 Gross per 24 hour  Intake 2101.18 ml  Output 0 ml  Net 2101.18 ml   Last 3 Weights 03/05/2021 03/04/2021 03/03/2021  Weight (lbs) 139 lb 15.9 oz 160 lb 15 oz 161 lb 2.5 oz  Weight (kg) 63.5 kg 73 kg 73.1 kg      Telemetry    Atrial fibrillation heart rates in the 60s 70s mostly- Personally Reviewed  ECG    No new, right bundle branch - Personally Reviewed  Physical Exam   GEN: No acute distress.   Neck: No JVD Cardiac: Irreg irreg, no murmurs, rubs, or gallops.  Respiratory:  Mild wheeze bilaterally. GI: Soft, nontender, non-distended  MS: No edema; No deformity. Neuro:  Nonfocal  Psych:  Sleepy  Labs    High Sensitivity Troponin:   Recent Labs  Lab 03/03/21 2045 03/03/21 2136  TROPONINIHS 9 10      Chemistry Recent Labs  Lab 03/03/21 1653 03/04/21 0549 03/05/21 1157 03/06/21 0604 03/06/21 1456 03/07/21 0355  NA 114*   < > 119* 120* 120* 121*  K 3.5   < > 3.5 4.2  --  4.5  CL 82*   < > 90* 93*  --  95*  CO2 24   < > 24 22  --  19*  GLUCOSE 77   < > 107* 70  --  72  BUN 10   < > 7* <5*  --  5*  CREATININE 0.93   < > 0.89 0.86  --  0.90  CALCIUM 8.1*   < > 7.4* 7.3*  --  7.7*  PROT 6.5  --   --   --   --   --   ALBUMIN 3.5  --   --   --   --   --   AST 61*  --   --   --   --   --   ALT 20  --   --   --   --   --  ALKPHOS 45  --   --   --   --   --   BILITOT 0.7  --   --   --   --   --   GFRNONAA >60   < > >60 >60  --  >60  ANIONGAP 8   < > 5 5  --  7   < > = values in this interval not displayed.     Hematology Recent Labs  Lab 03/03/21 1653 03/04/21 0549  WBC 4.4 3.4*  RBC 3.58* 3.18*  HGB 11.4* 10.2*  HCT 30.4* 27.2*  MCV 84.9 85.5  MCH 31.8 32.1  MCHC 37.5* 37.5*  RDW 11.7 11.8  PLT 122* 74*    BNP Recent Labs  Lab 03/06/21 0604  BNP 402.7*     DDimer No results for input(s): DDIMER in the last 168 hours.   Radiology    CT HEAD WO CONTRAST  Result Date: 03/07/2021 CLINICAL DATA:  Initial evaluation for acute mental status change. EXAM: CT HEAD WITHOUT CONTRAST TECHNIQUE: Contiguous axial images were obtained from the base of the skull through the vertex without intravenous contrast. COMPARISON:  None available. FINDINGS: Brain: Generalized age-related cerebral atrophy with mild chronic small vessel ischemic disease. No acute intracranial hemorrhage. No visible acute large vessel territory infarct. No mass lesion, midline shift or mass effect. No hydrocephalus. No extra-axial fluid collection. Vascular: There is question of asymmetric hyperdensity versus artifact involving a proximal right M2 branch (series 6, image 21). While this  is favored to be artifactual, possible thrombus could conceivably have this appearance as well. No other hyperdense vessel. Skull: Scalp soft tissues within normal limits.  Calvarium intact. Sinuses/Orbits: Globes and orbital soft tissues within normal limits. Visualized paranasal sinuses are largely clear. No significant mastoid effusion. Other: None. IMPRESSION: 1. Question asymmetric hyperdensity involving a proximal right M2 branch vessel as above. While this is favored to be artifactual, possible thrombus could also have this appearance. If there is clinical concern for possible developing right MCA territory ischemia, then further evaluation with dedicated CTA would be recommended for further evaluation. 2. No other acute intracranial abnormality. No visible evidence for acute ischemia on this exam. 3. Generalized age-related cerebral atrophy with mild chronic small vessel ischemic disease. Electronically Signed   By: Rise Mu M.D.   On: 03/07/2021 03:42   DG CHEST PORT 1 VIEW  Result Date: 03/06/2021 CLINICAL DATA:  Hypoxia. EXAM: PORTABLE CHEST 1 VIEW COMPARISON:  None. FINDINGS: There is cardiomegaly. There are prominent interstitial lung markings which appear to be new since the recent CT. There is suggestion of a new small right-sided pleural effusion. No focal infiltrate. No pneumothorax. No definite acute osseous abnormality. IMPRESSION: Cardiomegaly with findings concerning for developing volume overload with a small right-sided pleural effusion which is new from prior CT. Electronically Signed   By: Katherine Mantle M.D.   On: 03/06/2021 22:49   ECHOCARDIOGRAM COMPLETE  Result Date: 03/05/2021    ECHOCARDIOGRAM REPORT   Patient Name:   Glen Roy Date of Exam: 03/05/2021 Medical Rec #:  [id number]              Height: Accession #:    [id number]             Weight:       160.9 lb Date of Birth:  26-Jul-1941              BSA:          1.747 m  Patient Age:    80 years                BP:           148/78 mmHg Patient Gender: M                      HR:           70 bpm. Exam Location:  Inpatient Procedure: 2D Echo, Cardiac Doppler and Color Doppler Indications:    Atrial Fibrillation  History:        Patient has no prior history of Echocardiogram examinations.  Sonographer:    Shirlean Kelly Referring Phys: [id number] ERIC J [place] IMPRESSIONS  1. Left ventricular ejection fraction, by estimation, is 35 to 40%. The left ventricle has moderately decreased function. The left ventricle demonstrates global hypokinesis. Left ventricular diastolic function could not be evaluated.  2. Right ventricular systolic function is mildly reduced. The right ventricular size is normal. There is normal pulmonary artery systolic pressure. The estimated right ventricular systolic pressure is 30.7 mmHg.  3. Left atrial size was mildly dilated.  4. Right atrial size was mildly dilated.  5. The mitral valve is grossly normal. Mild to moderate mitral valve regurgitation. No evidence of mitral stenosis.  6. The aortic valve is tricuspid. Aortic valve regurgitation is mild. No aortic stenosis is present.  7. The inferior vena cava is normal in size with greater than 50% respiratory variability, suggesting right atrial pressure of 3 mmHg. FINDINGS  Left Ventricle: Left ventricular ejection fraction, by estimation, is 35 to 40%. The left ventricle has moderately decreased function. The left ventricle demonstrates global hypokinesis. The left ventricular internal cavity size was normal in size. There is no left ventricular hypertrophy. Left ventricular diastolic function could not be evaluated due to atrial fibrillation. Left ventricular diastolic function could not be evaluated. Right Ventricle: The right ventricular size is normal. No increase in right ventricular wall thickness. Right ventricular systolic function is mildly reduced. There is normal pulmonary artery systolic pressure. The tricuspid regurgitant  velocity is 2.63 m/s, and with an assumed right atrial pressure of 3 mmHg, the estimated right ventricular systolic pressure is 30.7 mmHg. Left Atrium: Left atrial size was mildly dilated. Right Atrium: Right atrial size was mildly dilated. Pericardium: Trivial pericardial effusion is present. Mitral Valve: The mitral valve is grossly normal. Mild to moderate mitral valve regurgitation. No evidence of mitral valve stenosis. Tricuspid Valve: The tricuspid valve is grossly normal. Tricuspid valve regurgitation is mild . No evidence of tricuspid stenosis. Aortic Valve: The aortic valve is tricuspid. Aortic valve regurgitation is mild. No aortic stenosis is present. Aortic valve mean gradient measures 2.0 mmHg. Aortic valve peak gradient measures 3.6 mmHg. Aortic valve area, by VTI measures 2.08 cm. Pulmonic Valve: The pulmonic valve was grossly normal. Pulmonic valve regurgitation is not visualized. No evidence of pulmonic stenosis. Aorta: The aortic root and ascending aorta are structurally normal, with no evidence of dilitation. Venous: The inferior vena cava is normal in size with greater than 50% respiratory variability, suggesting right atrial pressure of 3 mmHg. IAS/Shunts: The atrial septum is grossly normal.  LEFT VENTRICLE PLAX 2D LVIDd:         4.90 cm LVIDs:         3.50 cm LV PW:         0.90 cm LV IVS:        0.80 cm LVOT diam:     2.00  cm LV SV:         38 LV SV Index:   22 LVOT Area:     3.14 cm  LV Volumes (MOD) LV vol d, MOD A2C: 87.5 ml LV vol d, MOD A4C: 85.9 ml LV vol s, MOD A2C: 52.5 ml LV vol s, MOD A4C: 51.0 ml LV SV MOD A2C:     35.0 ml LV SV MOD A4C:     85.9 ml LV SV MOD BP:      34.5 ml RIGHT VENTRICLE            IVC RV Basal diam:  3.00 cm    IVC diam: 1.50 cm RV S prime:     8.92 cm/s LEFT ATRIUM             Index       RIGHT ATRIUM           Index LA diam:        3.70 cm 2.12 cm/m  RA Area:     29.20 cm LA Vol (A2C):   91.6 ml 52.43 ml/m RA Volume:   105.50 ml 60.39 ml/m LA Vol  (A4C):   93.0 ml 53.24 ml/m LA Biplane Vol: 97.8 ml 55.98 ml/m  AORTIC VALVE AV Area (Vmax):    2.29 cm AV Area (Vmean):   2.00 cm AV Area (VTI):     2.08 cm AV Vmax:           95.30 cm/s AV Vmean:          73.600 cm/s AV VTI:            0.181 m AV Peak Grad:      3.6 mmHg AV Mean Grad:      2.0 mmHg LVOT Vmax:         69.50 cm/s LVOT Vmean:        46.800 cm/s LVOT VTI:          0.120 m LVOT/AV VTI ratio: 0.66  AORTA Ao Root diam: 3.30 cm Ao Asc diam:  3.40 cm MITRAL VALVE               TRICUSPID VALVE MV Area (PHT): 4.31 cm    TR Peak grad:   27.7 mmHg MV Decel Time: 176 msec    TR Vmax:        263.00 cm/s MV E velocity: 98.50 cm/s                            SHUNTS                            Systemic VTI:  0.12 m                            Systemic Diam: 2.00 cm Lennie Odor MD Electronically signed by Lennie Odor MD Signature Date/Time: 03/05/2021/3:29:28 PM    Final     Cardiac Studies   EF35-40%  Patient Profile     80 y.o. 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  No need for cardioversion with good overall rate control -Unclear etiology of cardiomyopathy.  At some point may be helpful  to exclude ischemia however we will treat with medications such as ARB and beta-blocker at this time. -I will go ahead and increase his losartan to 50 mg a day and metoprolol to 25 mg twice a day.  Hyponatremia - Challenging.  Nephrology on board.  Giving Lasix IV.  Agree especially with mild wheeze heard on exam.  Aortic atherosclerosis - Would recommend statin therapy.    For questions or updates, please contact [facility] Please consult [email or web address] for contact info under        Signed, Donato SchultzMark Maximum Reiland, MD  03/07/2021, 12:48 PM

## 2021-03-07 NOTE — Progress Notes (Addendum)
[facility] PROGRESS NOTE  Assessment/ Plan: Pt is a 80 y.o. yo male  Spanish-speaking with no known significant past medical history admitted on 4/26 for nausea vomiting, decreased oral intake and abdominal pain, seen as a consultation for the evaluation of hyponatremia.  He was also found to have new diagnosis of A. fib, systolic CHF and hypertension.  #Hyponatremia initially hypovolemic in the setting of nausea vomiting and reduced oral intake.  TSH level acceptable.  Patient received IV fluid with improvement of sodium level.  Now, he is getting volume overload therefore stopped IV fluid and order a dose of Lasix IV.  Continue salt tablet, repeat urinalysis today.  Monitor lab.  Patient is asymptomatic.  #Nausea vomiting and constipation: CT scan of abdomen pelvis unremarkable.  He was a started on Linzess and MiraLAX and now having diarrhea.  Discontinued Linzess and on MiraLAX as needed.  #Hypertension: On metoprolol and losartan.  Monitor blood pressure.  #New diagnosis of systolic CHF, seen by cardiologist.  Continue losartan and metoprolol.  A dose of Lasix as above.   #Hypomagnesemia: Replete another dose of magnesium IV today.  #A. fib: On metoprolol and started Eliquis.  #Generalized weakness/deconditioning: PT OT eval and supportive care.  Discussed with the patient's son with the help of a Spanish translation.  Subjective: Seen and examined.  He developed short of breath overnight and chest x-ray with congestion.  Lowered IV fluid.  Currently having some cough but looks comfortable.  His son is at bedside. Objective Vital signs in last 24 hours: Vitals:   03/06/21 2048 03/06/21 2053 03/07/21 0203 03/07/21 0636  BP: 135/89  (!) 145/68 (!) 161/97  Pulse: 86 89 76 (!) 101  Resp: 18 18 17 19   Temp: 98.7 F (37.1 C)  99.1 F (37.3 C) (!) 97.3 F (36.3 C)  TempSrc: Oral  Oral Oral  SpO2: (!) 84% 93% 95% 96%  Weight:       Weight change:    Intake/Output Summary (Last 24 hours) at 03/07/2021 0954 Last data filed at 03/07/2021 0600 Gross per 24 hour  Intake 2747 ml  Output 0 ml  Net 2747 ml       Labs: Basic Metabolic Panel: Recent Labs  Lab 03/05/21 1157 03/06/21 0604 03/06/21 1456 03/07/21 0355  NA 119* 120* 120* 121*  K 3.5 4.2  --  4.5  CL 90* 93*  --  95*  CO2 24 22  --  19*  GLUCOSE 107* 70  --  72  BUN 7* <5*  --  5*  CREATININE 0.89 0.86  --  0.90  CALCIUM 7.4* 7.3*  --  7.7*   Liver Function Tests: Recent Labs  Lab 03/03/21 1653  AST 61*  ALT 20  ALKPHOS 45  BILITOT 0.7  PROT 6.5  ALBUMIN 3.5   Recent Labs  Lab 03/03/21 1653  LIPASE 27   No results for input(s): AMMONIA in the last 168 hours. CBC: Recent Labs  Lab 03/03/21 1653 03/04/21 0549  WBC 4.4 3.4*  NEUTROABS 2.5  --   HGB 11.4* 10.2*  HCT 30.4* 27.2*  MCV 84.9 85.5  PLT 122* 74*   Cardiac Enzymes: No results for input(s): CKTOTAL, CKMB, CKMBINDEX, TROPONINI in the last 168 hours. CBG: No results for input(s): GLUCAP in the last 168 hours.  Iron Studies: No results for input(s): IRON, TIBC, TRANSFERRIN, FERRITIN in the last 72 hours. Studies/Results: CT HEAD WO CONTRAST  Result Date: 03/07/2021 CLINICAL DATA:  Initial evaluation  for acute mental status change. EXAM: CT HEAD WITHOUT CONTRAST TECHNIQUE: Contiguous axial images were obtained from the base of the skull through the vertex without intravenous contrast. COMPARISON:  None available. FINDINGS: Brain: Generalized age-related cerebral atrophy with mild chronic small vessel ischemic disease. No acute intracranial hemorrhage. No visible acute large vessel territory infarct. No mass lesion, midline shift or mass effect. No hydrocephalus. No extra-axial fluid collection. Vascular: There is question of asymmetric hyperdensity versus artifact involving a proximal right M2 branch (series 6, image 21). While this is favored to be artifactual, possible thrombus could  conceivably have this appearance as well. No other hyperdense vessel. Skull: Scalp soft tissues within normal limits.  Calvarium intact. Sinuses/Orbits: Globes and orbital soft tissues within normal limits. Visualized paranasal sinuses are largely clear. No significant mastoid effusion. Other: None. IMPRESSION: 1. Question asymmetric hyperdensity involving a proximal right M2 branch vessel as above. While this is favored to be artifactual, possible thrombus could also have this appearance. If there is clinical concern for possible developing right MCA territory ischemia, then further evaluation with dedicated CTA would be recommended for further evaluation. 2. No other acute intracranial abnormality. No visible evidence for acute ischemia on this exam. 3. Generalized age-related cerebral atrophy with mild chronic small vessel ischemic disease. Electronically Signed   By: Rise Mu M.D.   On: 03/07/2021 03:42   DG CHEST PORT 1 VIEW  Result Date: 03/06/2021 CLINICAL DATA:  Hypoxia. EXAM: PORTABLE CHEST 1 VIEW COMPARISON:  None. FINDINGS: There is cardiomegaly. There are prominent interstitial lung markings which appear to be new since the recent CT. There is suggestion of a new small right-sided pleural effusion. No focal infiltrate. No pneumothorax. No definite acute osseous abnormality. IMPRESSION: Cardiomegaly with findings concerning for developing volume overload with a small right-sided pleural effusion which is new from prior CT. Electronically Signed   By: Katherine Mantle M.D.   On: 03/06/2021 22:49   ECHOCARDIOGRAM COMPLETE  Result Date: 03/05/2021    ECHOCARDIOGRAM REPORT   Patient Name:   Glen Roy Date of Exam: 03/05/2021 Medical Rec #:  [id number]              Height: Accession #:    [id number]             Weight:       160.9 lb Date of Birth:  Aug 03, 1941              BSA:          1.747 m Patient Age:    80 years               BP:           148/78 mmHg Patient Gender: M                       HR:           70 bpm. Exam Location:  Inpatient Procedure: 2D Echo, Cardiac Doppler and Color Doppler Indications:    Atrial Fibrillation  History:        Patient has no prior history of Echocardiogram examinations.  Sonographer:    Shirlean Kelly Referring Phys: [id number] ERIC J [place] IMPRESSIONS  1. Left ventricular ejection fraction, by estimation, is 35 to 40%. The left ventricle has moderately decreased function. The left ventricle demonstrates global hypokinesis. Left ventricular diastolic function could not be evaluated.  2. Right ventricular systolic function is mildly reduced. The right ventricular size  is normal. There is normal pulmonary artery systolic pressure. The estimated right ventricular systolic pressure is 30.7 mmHg.  3. Left atrial size was mildly dilated.  4. Right atrial size was mildly dilated.  5. The mitral valve is grossly normal. Mild to moderate mitral valve regurgitation. No evidence of mitral stenosis.  6. The aortic valve is tricuspid. Aortic valve regurgitation is mild. No aortic stenosis is present.  7. The inferior vena cava is normal in size with greater than 50% respiratory variability, suggesting right atrial pressure of 3 mmHg. FINDINGS  Left Ventricle: Left ventricular ejection fraction, by estimation, is 35 to 40%. The left ventricle has moderately decreased function. The left ventricle demonstrates global hypokinesis. The left ventricular internal cavity size was normal in size. There is no left ventricular hypertrophy. Left ventricular diastolic function could not be evaluated due to atrial fibrillation. Left ventricular diastolic function could not be evaluated. Right Ventricle: The right ventricular size is normal. No increase in right ventricular wall thickness. Right ventricular systolic function is mildly reduced. There is normal pulmonary artery systolic pressure. The tricuspid regurgitant velocity is 2.63 m/s, and with an assumed right atrial  pressure of 3 mmHg, the estimated right ventricular systolic pressure is 30.7 mmHg. Left Atrium: Left atrial size was mildly dilated. Right Atrium: Right atrial size was mildly dilated. Pericardium: Trivial pericardial effusion is present. Mitral Valve: The mitral valve is grossly normal. Mild to moderate mitral valve regurgitation. No evidence of mitral valve stenosis. Tricuspid Valve: The tricuspid valve is grossly normal. Tricuspid valve regurgitation is mild . No evidence of tricuspid stenosis. Aortic Valve: The aortic valve is tricuspid. Aortic valve regurgitation is mild. No aortic stenosis is present. Aortic valve mean gradient measures 2.0 mmHg. Aortic valve peak gradient measures 3.6 mmHg. Aortic valve area, by VTI measures 2.08 cm. Pulmonic Valve: The pulmonic valve was grossly normal. Pulmonic valve regurgitation is not visualized. No evidence of pulmonic stenosis. Aorta: The aortic root and ascending aorta are structurally normal, with no evidence of dilitation. Venous: The inferior vena cava is normal in size with greater than 50% respiratory variability, suggesting right atrial pressure of 3 mmHg. IAS/Shunts: The atrial septum is grossly normal.  LEFT VENTRICLE PLAX 2D LVIDd:         4.90 cm LVIDs:         3.50 cm LV PW:         0.90 cm LV IVS:        0.80 cm LVOT diam:     2.00 cm LV SV:         38 LV SV Index:   22 LVOT Area:     3.14 cm  LV Volumes (MOD) LV vol d, MOD A2C: 87.5 ml LV vol d, MOD A4C: 85.9 ml LV vol s, MOD A2C: 52.5 ml LV vol s, MOD A4C: 51.0 ml LV SV MOD A2C:     35.0 ml LV SV MOD A4C:     85.9 ml LV SV MOD BP:      34.5 ml RIGHT VENTRICLE            IVC RV Basal diam:  3.00 cm    IVC diam: 1.50 cm RV S prime:     8.92 cm/s LEFT ATRIUM             Index       RIGHT ATRIUM           Index LA diam:  3.70 cm 2.12 cm/m  RA Area:     29.20 cm LA Vol (A2C):   91.6 ml 52.43 ml/m RA Volume:   105.50 ml 60.39 ml/m LA Vol (A4C):   93.0 ml 53.24 ml/m LA Biplane Vol: 97.8 ml 55.98  ml/m  AORTIC VALVE AV Area (Vmax):    2.29 cm AV Area (Vmean):   2.00 cm AV Area (VTI):     2.08 cm AV Vmax:           95.30 cm/s AV Vmean:          73.600 cm/s AV VTI:            0.181 m AV Peak Grad:      3.6 mmHg AV Mean Grad:      2.0 mmHg LVOT Vmax:         69.50 cm/s LVOT Vmean:        46.800 cm/s LVOT VTI:          0.120 m LVOT/AV VTI ratio: 0.66  AORTA Ao Root diam: 3.30 cm Ao Asc diam:  3.40 cm MITRAL VALVE               TRICUSPID VALVE MV Area (PHT): 4.31 cm    TR Peak grad:   27.7 mmHg MV Decel Time: 176 msec    TR Vmax:        263.00 cm/s MV E velocity: 98.50 cm/s                            SHUNTS                            Systemic VTI:  0.12 m                            Systemic Diam: 2.00 cm Lennie Odor MD Electronically signed by Lennie Odor MD Signature Date/Time: 03/05/2021/3:29:28 PM    Final     Medications: Infusions: . magnesium sulfate bolus IVPB    . promethazine (PHENERGAN) injection (IM or IVPB)      Scheduled Medications: . apixaban  5 mg Oral BID  . atorvastatin  20 mg Oral Daily  . feeding supplement  237 mL Oral BID BM  . fluticasone  2 spray Each Nare Daily  . furosemide  40 mg Intravenous Once  . loratadine  10 mg Oral Daily  . losartan  25 mg Oral Daily  . metoprolol tartrate  12.5 mg Oral BID  . sodium chloride  1 g Oral TID WC    have reviewed scheduled and prn medications.  Physical Exam: General:NAD, comfortable Heart:RRR, s1s2 nl Lungs: Basal crackles, no increased work of breathing Abdomen:soft, Non-tender, non-distended Extremities:No edema Neurology: Alert awake  Glen Roy Glen Roy 03/07/2021,9:54 AM  LOS: 4 days

## 2021-03-08 DIAGNOSIS — U071 COVID-19: Secondary | ICD-10-CM

## 2021-03-08 DIAGNOSIS — N179 Acute kidney failure, unspecified: Secondary | ICD-10-CM

## 2021-03-08 HISTORY — DX: Acute kidney failure, unspecified: N17.9

## 2021-03-08 HISTORY — DX: COVID-19: U07.1

## 2021-03-08 LAB — CBC
HCT: 29.4 % — ABNORMAL LOW (ref 39.0–52.0)
Hemoglobin: 10.8 g/dL — ABNORMAL LOW (ref 13.0–17.0)
MCH: 31.5 pg (ref 26.0–34.0)
MCHC: 36.7 g/dL — ABNORMAL HIGH (ref 30.0–36.0)
MCV: 85.7 fL (ref 80.0–100.0)
Platelets: 97 10*3/uL — ABNORMAL LOW (ref 150–400)
RBC: 3.43 MIL/uL — ABNORMAL LOW (ref 4.22–5.81)
RDW: 12.2 % (ref 11.5–15.5)
WBC: 4.2 10*3/uL (ref 4.0–10.5)
nRBC: 0 % (ref 0.0–0.2)

## 2021-03-08 LAB — RENAL FUNCTION PANEL
Albumin: 2.7 g/dL — ABNORMAL LOW (ref 3.5–5.0)
Anion gap: 5 (ref 5–15)
BUN: 6 mg/dL — ABNORMAL LOW (ref 8–23)
CO2: 27 mmol/L (ref 22–32)
Calcium: 7.6 mg/dL — ABNORMAL LOW (ref 8.9–10.3)
Chloride: 90 mmol/L — ABNORMAL LOW (ref 98–111)
Creatinine, Ser: 1.11 mg/dL (ref 0.61–1.24)
GFR, Estimated: >60 mL/min (ref >60)
Glucose, Bld: 79 mg/dL (ref 70–99)
Phosphorus: 1.9 mg/dL — ABNORMAL LOW (ref 2.5–4.6)
Potassium: 3.9 mmol/L (ref 3.5–5.1)
Sodium: 122 mmol/L — ABNORMAL LOW (ref 135–145)

## 2021-03-08 LAB — MAGNESIUM: Magnesium: 1.6 mg/dL — ABNORMAL LOW (ref 1.7–2.4)

## 2021-03-08 MED ORDER — MAGNESIUM SULFATE 2 GM/50ML IV SOLN
2.0000 g | Freq: Once | INTRAVENOUS | Status: AC
  Administered 2021-03-08: 2 g via INTRAVENOUS
  Filled 2021-03-08: qty 50

## 2021-03-08 MED ORDER — FUROSEMIDE 10 MG/ML IJ SOLN
40.0000 mg | Freq: Once | INTRAMUSCULAR | Status: AC
  Administered 2021-03-08: 40 mg via INTRAVENOUS
  Filled 2021-03-08: qty 4

## 2021-03-08 MED ORDER — GUAIFENESIN-DM 100-10 MG/5ML PO SYRP
5.0000 mL | ORAL_SOLUTION | ORAL | Status: DC | PRN
  Administered 2021-03-08 – 2021-03-10 (×5): 5 mL via ORAL
  Filled 2021-03-08 (×3): qty 5

## 2021-03-08 NOTE — Progress Notes (Signed)
[facility] PROGRESS NOTE  Assessment/ Plan: Pt is a 80 y.o. yo male  Spanish-speaking with no known significant past medical history admitted on 4/26 for nausea vomiting, decreased oral intake and abdominal pain, seen as a consultation for the evaluation of hyponatremia.  He was also found to have new diagnosis of A. fib, systolic CHF and hypertension.  #Hyponatremia initially hypovolemic in the setting of nausea vomiting and reduced oral intake.  TSH level acceptable.  Patient received IV fluid with improvement of sodium level.  Now, he is getting volume overload therefore stopped IV fluid and received IV Lasix yesterday.  Serum sodium level 122,  ordering another dose of Lasix 40 mg IV today.  Continue salt tablet, repeat urinalysis.  Monitor lab.  Patient is asymptomatic.  #Nausea vomiting and constipation: CT scan of abdomen pelvis unremarkable.  He was a started on Linzess and MiraLAX and now having diarrhea.  Discontinued Linzess and on MiraLAX as needed.  #Hypertension: On metoprolol and losartan.  Monitor blood pressure.  #New diagnosis of systolic CHF, seen by cardiologist.  Continue losartan and metoprolol.  A dose of Lasix as above.   #Hypomagnesemia: Replete magnesium IV.  Monitor lab.  #A. fib: On metoprolol and started Eliquis.  #Generalized weakness/deconditioning: PT OT eval and supportive care.  Discussed with the patient's son with the help of a Spanish translation.  Subjective: Seen and examined.  Received Lasix yesterday with 3.4 L of urine output.  Sodium  122.  He is still requiring oxygen and has some crackles on the bases.  His son at bedside.  Objective Vital signs in last 24 hours: Vitals:   03/07/21 1649 03/07/21 2044 03/08/21 0544 03/08/21 0940  BP: (!) 148/86 135/90 (!) 102/55 130/68  Pulse: 80 72 61 64  Resp: 18 16 16 19   Temp: 99.4 F (37.4 C) (!) 100.6 F (38.1 C) 99.1 F (37.3 C) 98.1 F (36.7 C)  TempSrc: Oral Oral  Oral Oral  SpO2: 100% 97% 100% 100%  Weight:       Weight change:   Intake/Output Summary (Last 24 hours) at 03/08/2021 1052 Last data filed at 03/08/2021 0600 Gross per 24 hour  Intake 240 ml  Output 3400 ml  Net -3160 ml       Labs: Basic Metabolic Panel: Recent Labs  Lab 03/06/21 0604 03/06/21 1456 03/07/21 0355 03/08/21 0252  NA 120* 120* 121* 122*  K 4.2  --  4.5 3.9  CL 93*  --  95* 90*  CO2 22  --  19* 27  GLUCOSE 70  --  72 79  BUN <5*  --  5* 6*  CREATININE 0.86  --  0.90 1.11  CALCIUM 7.3*  --  7.7* 7.6*  PHOS  --   --   --  1.9*   Liver Function Tests: Recent Labs  Lab 03/03/21 1653 03/08/21 0252  AST 61*  --   ALT 20  --   ALKPHOS 45  --   BILITOT 0.7  --   PROT 6.5  --   ALBUMIN 3.5 2.7*   Recent Labs  Lab 03/03/21 1653  LIPASE 27   No results for input(s): AMMONIA in the last 168 hours. CBC: Recent Labs  Lab 03/03/21 1653 03/04/21 0549 03/08/21 0252  WBC 4.4 3.4* 4.2  NEUTROABS 2.5  --   --   HGB 11.4* 10.2* 10.8*  HCT 30.4* 27.2* 29.4*  MCV 84.9 85.5 85.7  PLT 122* 74* 97*   Cardiac  Enzymes: No results for input(s): CKTOTAL, CKMB, CKMBINDEX, TROPONINI in the last 168 hours. CBG: No results for input(s): GLUCAP in the last 168 hours.  Iron Studies: No results for input(s): IRON, TIBC, TRANSFERRIN, FERRITIN in the last 72 hours. Studies/Results: CT HEAD WO CONTRAST  Result Date: 03/07/2021 CLINICAL DATA:  Initial evaluation for acute mental status change. EXAM: CT HEAD WITHOUT CONTRAST TECHNIQUE: Contiguous axial images were obtained from the base of the skull through the vertex without intravenous contrast. COMPARISON:  None available. FINDINGS: Brain: Generalized age-related cerebral atrophy with mild chronic small vessel ischemic disease. No acute intracranial hemorrhage. No visible acute large vessel territory infarct. No mass lesion, midline shift or mass effect. No hydrocephalus. No extra-axial fluid collection. Vascular: There  is question of asymmetric hyperdensity versus artifact involving a proximal right M2 branch (series 6, image 21). While this is favored to be artifactual, possible thrombus could conceivably have this appearance as well. No other hyperdense vessel. Skull: Scalp soft tissues within normal limits.  Calvarium intact. Sinuses/Orbits: Globes and orbital soft tissues within normal limits. Visualized paranasal sinuses are largely clear. No significant mastoid effusion. Other: None. IMPRESSION: 1. Question asymmetric hyperdensity involving a proximal right M2 branch vessel as above. While this is favored to be artifactual, possible thrombus could also have this appearance. If there is clinical concern for possible developing right MCA territory ischemia, then further evaluation with dedicated CTA would be recommended for further evaluation. 2. No other acute intracranial abnormality. No visible evidence for acute ischemia on this exam. 3. Generalized age-related cerebral atrophy with mild chronic small vessel ischemic disease. Electronically Signed   By: Rise Mu M.D.   On: 03/07/2021 03:42   DG CHEST PORT 1 VIEW  Result Date: 03/06/2021 CLINICAL DATA:  Hypoxia. EXAM: PORTABLE CHEST 1 VIEW COMPARISON:  None. FINDINGS: There is cardiomegaly. There are prominent interstitial lung markings which appear to be new since the recent CT. There is suggestion of a new small right-sided pleural effusion. No focal infiltrate. No pneumothorax. No definite acute osseous abnormality. IMPRESSION: Cardiomegaly with findings concerning for developing volume overload with a small right-sided pleural effusion which is new from prior CT. Electronically Signed   By: Katherine Mantle M.D.   On: 03/06/2021 22:49    Medications: Infusions: . magnesium sulfate bolus IVPB 2 g (03/08/21 1004)  . promethazine (PHENERGAN) injection (IM or IVPB)      Scheduled Medications: . apixaban  5 mg Oral BID  . atorvastatin  20 mg Oral  Daily  . feeding supplement  237 mL Oral BID BM  . fluticasone  2 spray Each Nare Daily  . furosemide  40 mg Intravenous Once  . loratadine  10 mg Oral Daily  . losartan  50 mg Oral Daily  . metoprolol tartrate  25 mg Oral BID  . sodium chloride  1 g Oral TID WC    have reviewed scheduled and prn medications.  Physical Exam: General: Not in distress, requiring oxygen Heart:RRR, s1s2 nl Lungs: Bibasal crackles, no increased work of breathing Abdomen:soft, Non-tender, non-distended Extremities:No peripheral edema Neurology: Alert awake  [facility] 03/08/2021,10:52 AM  LOS: 5 days

## 2021-03-08 NOTE — Progress Notes (Signed)
Progress Note  Patient Name: [facility] Date of Encounter: 03/08/2021  [facility] Cardiologist: No primary care provider on file. New [facility]  Subjective   Tired  Inpatient Medications    Scheduled Meds: . apixaban  5 mg Oral BID  . atorvastatin  20 mg Oral Daily  . feeding supplement  237 mL Oral BID BM  . fluticasone  2 spray Each Nare Daily  . furosemide  40 mg Intravenous Once  . loratadine  10 mg Oral Daily  . losartan  50 mg Oral Daily  . metoprolol tartrate  25 mg Oral BID  . sodium chloride  1 g Oral TID WC   Continuous Infusions: . magnesium sulfate bolus IVPB 2 g (03/08/21 1004)  . promethazine (PHENERGAN) injection (IM or IVPB)     PRN Meds: acetaminophen **OR** acetaminophen, dicyclomine, polyethylene glycol, promethazine (PHENERGAN) injection (IM or IVPB), sodium chloride   Vital Signs    Vitals:   03/07/21 1649 03/07/21 2044 03/08/21 0544 03/08/21 0940  BP: (!) 148/86 135/90 (!) 102/55 130/68  Pulse: 80 72 61 64  Resp: 18 16 16 19   Temp: 99.4 F (37.4 C) (!) 100.6 F (38.1 C) 99.1 F (37.3 C) 98.1 F (36.7 C)  TempSrc: Oral Oral Oral Oral  SpO2: 100% 97% 100% 100%  Weight:        Intake/Output Summary (Last 24 hours) at 03/08/2021 1058 Last data filed at 03/08/2021 0600 Gross per 24 hour  Intake 240 ml  Output 3400 ml  Net -3160 ml   Last 3 Weights 03/05/2021 03/04/2021 03/03/2021  Weight (lbs) 139 lb 15.9 oz 160 lb 15 oz 161 lb 2.5 oz  Weight (kg) 63.5 kg 73 kg 73.1 kg      Telemetry    Atrial fibrillation heart rates in the 60s 70s mostly- Personally Reviewed  ECG    No new, right bundle branch - Personally Reviewed  Physical Exam   GEN: Well nourished, well developed, in no acute distress  HEENT: normal  Neck: no JVD, carotid bruits, or masses Cardiac: irreg; no murmurs, rubs, or gallops,no edema  Respiratory:  clear to auscultation bilaterally, normal work of breathing GI: soft, nontender, nondistended, + BS MS:  no deformity or atrophy  Skin: warm and dry, no rash Neuro:  Alert and Oriented x 3, Strength and sensation are intact Psych: sleepy   Labs    High Sensitivity Troponin:   Recent Labs  Lab 03/03/21 2045 03/03/21 2136  TROPONINIHS 9 10      Chemistry Recent Labs  Lab 03/03/21 1653 03/04/21 0549 03/06/21 0604 03/06/21 1456 03/07/21 0355 03/08/21 0252  NA 114*   < > 120* 120* 121* 122*  K 3.5   < > 4.2  --  4.5 3.9  CL 82*   < > 93*  --  95* 90*  CO2 24   < > 22  --  19* 27  GLUCOSE 77   < > 70  --  72 79  BUN 10   < > <5*  --  5* 6*  CREATININE 0.93   < > 0.86  --  0.90 1.11  CALCIUM 8.1*   < > 7.3*  --  7.7* 7.6*  PROT 6.5  --   --   --   --   --   ALBUMIN 3.5  --   --   --   --  2.7*  AST 61*  --   --   --   --   --  ALT 20  --   --   --   --   --   ALKPHOS 45  --   --   --   --   --   BILITOT 0.7  --   --   --   --   --   GFRNONAA >60   < > >60  --  >60 >60  ANIONGAP 8   < > 5  --  7 5   < > = values in this interval not displayed.     Hematology Recent Labs  Lab 03/03/21 1653 03/04/21 0549 03/08/21 0252  WBC 4.4 3.4* 4.2  RBC 3.58* 3.18* 3.43*  HGB 11.4* 10.2* 10.8*  HCT 30.4* 27.2* 29.4*  MCV 84.9 85.5 85.7  MCH 31.8 32.1 31.5  MCHC 37.5* 37.5* 36.7*  RDW 11.7 11.8 12.2  PLT 122* 74* 97*    BNP Recent Labs  Lab 03/06/21 0604  BNP 402.7*     DDimer No results for input(s): DDIMER in the last 168 hours.   Radiology    CT HEAD WO CONTRAST  Result Date: 03/07/2021 CLINICAL DATA:  Initial evaluation for acute mental status change. EXAM: CT HEAD WITHOUT CONTRAST TECHNIQUE: Contiguous axial images were obtained from the base of the skull through the vertex without intravenous contrast. COMPARISON:  None available. FINDINGS: Brain: Generalized age-related cerebral atrophy with mild chronic small vessel ischemic disease. No acute intracranial hemorrhage. No visible acute large vessel territory infarct. No mass lesion, midline shift or mass effect.  No hydrocephalus. No extra-axial fluid collection. Vascular: There is question of asymmetric hyperdensity versus artifact involving a proximal right M2 branch (series 6, image 21). While this is favored to be artifactual, possible thrombus could conceivably have this appearance as well. No other hyperdense vessel. Skull: Scalp soft tissues within normal limits.  Calvarium intact. Sinuses/Orbits: Globes and orbital soft tissues within normal limits. Visualized paranasal sinuses are largely clear. No significant mastoid effusion. Other: None. IMPRESSION: 1. Question asymmetric hyperdensity involving a proximal right M2 branch vessel as above. While this is favored to be artifactual, possible thrombus could also have this appearance. If there is clinical concern for possible developing right MCA territory ischemia, then further evaluation with dedicated CTA would be recommended for further evaluation. 2. No other acute intracranial abnormality. No visible evidence for acute ischemia on this exam. 3. Generalized age-related cerebral atrophy with mild chronic small vessel ischemic disease. Electronically Signed   By: Rise Mu M.D.   On: 03/07/2021 03:42   DG CHEST PORT 1 VIEW  Result Date: 03/06/2021 CLINICAL DATA:  Hypoxia. EXAM: PORTABLE CHEST 1 VIEW COMPARISON:  None. FINDINGS: There is cardiomegaly. There are prominent interstitial lung markings which appear to be new since the recent CT. There is suggestion of a new small right-sided pleural effusion. No focal infiltrate. No pneumothorax. No definite acute osseous abnormality. IMPRESSION: Cardiomegaly with findings concerning for developing volume overload with a small right-sided pleural effusion which is new from prior CT. Electronically Signed   By: Katherine Mantle M.D.   On: 03/06/2021 22:49    Cardiac Studies   EF 35-40%  Patient Profile     80 y.o. male here with newly discovered atrial fibrillation, newly discovered cardiomyopathy  with systolic heart failure possibly chronic with hyponatremia dehydration, nausea vomiting, unremarkable CT abd  - Eliquis 5 mg a day currently rate controlled, continue with Lopressor - Watch for any signs of bleeding.  No need for cardioversion with good overall rate  control -Unclear etiology of cardiomyopathy.  At some point may be helpful to exclude ischemia however we will treat with medications such as ARB and beta-blocker at this time. - metoprolol 25 mg twice a day doing well.  Hyponatremia - Challenging.  Nephrology on board.  Giving Lasix IV.  Agree especially with mild wheeze heard on exam. Sight increase today to 122.   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    For questions or updates, please contact [facility] Please consult [email or web address] for contact info under        Signed, Donato Schultz, MD  03/08/2021, 10:58 AM

## 2021-03-08 NOTE — Progress Notes (Signed)
PROGRESS NOTE    Glen Roy  ZOX:[id number] DOB: [birth year] DOA: 03/03/2021 PCP: Pcp, No    Brief Narrative:  Glen Roy is a 80 year old male with no significant past medical history who presented to the ED on 4/26 with nausea/vomiting, abdominal pain and decreased appetite over the past 4 days.  Additionally, patient reported generalized weakness and overall feeling ill.  Patient reports feeling lightheaded when he stands up with generalized fatigue with any type of activity.  No bowel movement since Thursday.  Recently moved from [place] to [place] to be with family.  No change in dietary habits, no sick contacts.  Denies any raw or undercooked meat ingestion.  Denies headache, no chest pain, no palpitations, no shortness of breath, no cough.  Also denies tobacco, EtOH, illicit drug use.  In the ED, temperature 98.2 F, HR 74, RR 16, BP 140/69, SPO2 100% on room air.  Sodium 114, potassium 3.5, chloride 82, CO2 24, glucose 77, BUN 10, creatinine 0.93, AST 61, ALT 20, total bilirubin 0.7.  Lipase 27.  WBC 4.4, hemoglobin 11.4, platelets 122.  SARS-CoV-2 PCR negative.  Influenza A/B PCR negative.  Urinalysis unrevealing.  X-ray abdomen unrevealing.  CT abdomen/pelvis with contrast with no acute abnormality in the abdomen/pelvis, trace free fluid in the pelvis nonspecific, mild T12 and L2 superior endplate compression fracture and age-indeterminate.   Assessment & Plan:   Principal Problem:   Hyponatremia Active Problems:   Generalized weakness   Constipation   Nausea & vomiting   Prolonged QT interval   AF (paroxysmal atrial fibrillation) (HCC)   Malnutrition of moderate degree   Severe hyponatremia Patient presenting to the ED with progressive weakness, nausea/vomiting and associated abdominal pain.  Poor oral intake in the preceding days.  Patient was found to have a sodium of 114 on admission.  Denies EtOH use.  Serum osmolality low at 242.  Urine  osmolality low at 241, urine sodium less than 10.  Suspect hypovolemic hyponatremia from extrarenal loss secondary to nausea/vomiting and poor oral intake. --Nephrology following, appreciate assistance --Na 114>>117>>119>120>121>122 --Lasix  IV x 1 again today --Sodium chloride tablets 1 g p.o. 3 times daily --BMP daily --supportive care  Irritable bowel syndrome with constipation Abdominal x-ray and CT abdomen/pelvis with contrast unrevealing.  Suspect viral illness as patient is afebrile without leukocytosis.  No diarrhea.  No sick contacts.  Patient's son reports that patient used to take regularly medication from [place] called Brumurro de pinaverioo, which is an antispasmodic. --Discontinued Linzess due to multiple bowel movements --Bentyl as needed --Supportive care, antiemetics  Paroxysmal atrial fibrillation, new diagnosis On admission, EKG notable for atrial fibrillation.  Denies past history of arrhythmia.  Currently rate controlled, borderline bradycardic.  --Eliquis  PO BID --Continue to monitor on telemetry  Systolic congestive heart failure, new diagnosis Cardiomyopathy, unclear etiology TTE with LVEF 35-40%, LV moderately decreased function with global hypokinesis, RV systolic function mildly reduced, LA/RA mildly dilated, mild/moderate MR, IVC normal in size.  BNP 402.7. --Cardiology following, appreciate assistance --Metoprolol tartrate 50 mg p.o. twice daily --Losartan 50 mg p.o. daily --Furosemide 40 mg IV x1 today --Continues home oxygen, maintain SPO2 greater than 92%, on 2.5 L nasal cannula with SPO2 100% --Cardiology deferring ischemic evaluation at this time  Aortic atherosclerosis Incidentally noted on CT abdomen/pelvis.  Total cholesterol 156, HDL 42, LDL 108. --atorvastatin  PO daily  Weakness, deconditioning, gait disturbance --PT/OT recommends home health PT with rolling walker --Continue PT/OT efforts while inpatient   DVT prophylaxis:  Lovenox   Code Status: Full Code Family Communication: Updated patient's son who is present at bedside with aid of video translator.  Disposition Plan:  Level of care: Telemetry Medical Status is: Inpatient  Remains inpatient appropriate because:Ongoing diagnostic testing needed not appropriate for outpatient work up, Unsafe d/c plan, IV treatments appropriate due to intensity of illness or inability to take PO and Inpatient level of care appropriate due to severity of illness   Dispo: The patient is from: Home              Anticipated d/c is to: Home              Patient currently is not medically stable to d/c.   Difficult to place patient No   Consultants:   Cardiology  Nephrology, Dr. Ronalee Belts  Procedures:   TTE  Antimicrobials:   None   Subjective: Patient seen and examined at bedside, resting comfortably.  Patient sleeping but arousable but continues to appear very tired.  Son present at bedside.  Aided with video translator, [name].  Denies any further abdominal pain or bloating.  Continues with poor oral intake.  Sodium up to 122 this morning.  No other questions or concerns at this time.  Denies headache, no visual changes, no chest pain, no palpitations, no abdominal pain. No other acute concerns overnight per nursing staff.  Objective: Vitals:   03/07/21 1649 03/07/21 2044 03/08/21 0544 03/08/21 0940  BP: (!) 148/86 135/90 (!) 102/55 130/68  Pulse: 80 72 61 64  Resp: 18 16 16 19   Temp: 99.4 F (37.4 C) (!) 100.6 F (38.1 C) 99.1 F (37.3 C) 98.1 F (36.7 C)  TempSrc: Oral Oral Oral Oral  SpO2: 100% 97% 100% 100%  Weight:        Intake/Output Summary (Last 24 hours) at 03/08/2021 1259 Last data filed at 03/08/2021 0600 Gross per 24 hour  Intake 120 ml  Output 3400 ml  Net -3280 ml   Filed Weights   03/03/21 2145 03/04/21 2122 03/05/21 2107  Weight: 73.1 kg 73 kg 63.5 kg    Examination:  General exam: Appears calm and comfortable,  weak/debilitated, chronically ill in appearance HEENT: Dried crusted blood noted around gums/mouth Respiratory system: Decreased breath sounds bilateral bases with crackles, normal respiratory effort, on 2.5 L nasal cannula with SPO2 100% Cardiovascular system: S1 & S2 heard, irregularly irregular rhythm, normal rate. No JVD, murmurs, rubs, gallops or clicks. No pedal edema. Gastrointestinal system: Abdomen is nondistended, soft and nontender. No organomegaly or masses felt. Normal bowel sounds heard. Central nervous system: Alert and oriented. No focal neurological deficits. Extremities: Symmetric 5 x 5 power. Skin: No rashes, lesions or ulcers Psychiatry: Judgement and insight appear poor. Mood & affect appropriate.     Data Reviewed: I have personally reviewed following labs and imaging studies  CBC: Recent Labs  Lab 03/03/21 1653 03/04/21 0549 03/08/21 0252  WBC 4.4 3.4* 4.2  NEUTROABS 2.5  --   --   HGB 11.4* 10.2* 10.8*  HCT 30.4* 27.2* 29.4*  MCV 84.9 85.5 85.7  PLT 122* 74* 97*   Basic Metabolic Panel: Recent Labs  Lab 03/05/21 0510 03/05/21 1157 03/06/21 0604 03/06/21 1456 03/07/21 0355 03/08/21 0252  NA 118* 119* 120* 120* 121* 122*  K 3.6 3.5 4.2  --  4.5 3.9  CL 89* 90* 93*  --  95* 90*  CO2 20* 24 22  --  19* 27  GLUCOSE 71 107* 70  --  72 79  BUN 7* 7* <5*  --  5* 6*  CREATININE 0.83 0.89 0.86  --  0.90 1.11  CALCIUM 7.1* 7.4* 7.3*  --  7.7* 7.6*  MG  --   --  1.3*  --  1.6* 1.6*  PHOS  --   --   --   --   --  1.9*   GFR: CrCl cannot be calculated (Unknown ideal weight.). Liver Function Tests: Recent Labs  Lab 03/03/21 1653 03/08/21 0252  AST 61*  --   ALT 20  --   ALKPHOS 45  --   BILITOT 0.7  --   PROT 6.5  --   ALBUMIN 3.5 2.7*   Recent Labs  Lab 03/03/21 1653  LIPASE 27   No results for input(s): AMMONIA in the last 168 hours. Coagulation Profile: No results for input(s): INR, PROTIME in the last 168 hours. Cardiac Enzymes: No  results for input(s): CKTOTAL, CKMB, CKMBINDEX, TROPONINI in the last 168 hours. BNP (last 3 results) No results for input(s): PROBNP in the last 8760 hours. HbA1C: Recent Labs    03/06/21 1456  HGBA1C 5.1   CBG: No results for input(s): GLUCAP in the last 168 hours. Lipid Profile: Recent Labs    03/06/21 0604  CHOL 156  HDL 42  LDLCALC 108*  TRIG 29  CHOLHDL 3.7   Thyroid Function Tests: No results for input(s): TSH, T4TOTAL, FREET4, T3FREE, THYROIDAB in the last 72 hours. Anemia Panel: No results for input(s): VITAMINB12, FOLATE, FERRITIN, TIBC, IRON, RETICCTPCT in the last 72 hours. Sepsis Labs: No results for input(s): PROCALCITON, LATICACIDVEN in the last 168 hours.  Recent Results (from the past 240 hour(s))  Resp Panel by RT-PCR (Flu A&B, Covid) Nasopharyngeal Swab     Status: None   Collection Time: 03/03/21  8:20 PM   Specimen: Nasopharyngeal Swab; Nasopharyngeal(NP) swabs in vial transport medium  Result Value Ref Range Status   SARS Coronavirus 2 by RT PCR NEGATIVE NEGATIVE Final    Comment: (NOTE) SARS-CoV-2 target nucleic acids are NOT DETECTED.  The SARS-CoV-2 RNA is generally detectable in upper respiratory specimens during the acute phase of infection. The lowest concentration of SARS-CoV-2 viral copies this assay can detect is 138 copies/mL. A negative result does not preclude SARS-Cov-2 infection and should not be used as the sole basis for treatment or other patient management decisions. A negative result may occur with  improper specimen collection/handling, submission of specimen other than nasopharyngeal swab, presence of viral mutation(s) within the areas targeted by this assay, and inadequate number of viral copies(<138 copies/mL). A negative result must be combined with clinical observations, patient history, and epidemiological information. The expected result is Negative.  Fact Sheet for Patients:   BloggerCourse.com  Fact Sheet for Healthcare Providers:  SeriousBroker.it  This test is no t yet approved or cleared by the [place] FDA and  has been authorized for detection and/or diagnosis of SARS-CoV-2 by FDA under an Emergency Use Authorization (EUA). This EUA will remain  in effect (meaning this test can be used) for the duration of the COVID-19 declaration under Section 564(b)(1) of the Act, 21 U.S.C.section 360bbb-3(b)(1), unless the authorization is terminated  or revoked sooner.       Influenza A by PCR NEGATIVE NEGATIVE Final   Influenza B by PCR NEGATIVE NEGATIVE Final    Comment: (NOTE) The Xpert Xpress SARS-CoV-2/FLU/RSV plus assay is intended as an aid in the diagnosis of influenza from Nasopharyngeal swab specimens and should  not be used as a sole basis for treatment. Nasal washings and aspirates are unacceptable for Xpert Xpress SARS-CoV-2/FLU/RSV testing.  Fact Sheet for Patients: BloggerCourse.comhttps://www.fda.gov/media/152166/download  Fact Sheet for Healthcare Providers: SeriousBroker.ithttps://www.fda.gov/media/152162/download  This test is not yet approved or cleared by the [place] FDA and has been authorized for detection and/or diagnosis of SARS-CoV-2 by FDA under an Emergency Use Authorization (EUA). This EUA will remain in effect (meaning this test can be used) for the duration of the COVID-19 declaration under Section 564(b)(1) of the Act, 21 U.S.C. section 360bbb-3(b)(1), unless the authorization is terminated or revoked.  Performed at [facility], [address] [place], [place] [address]          Radiology Studies: CT HEAD WO CONTRAST  Result Date: 03/07/2021 CLINICAL DATA:  Initial evaluation for acute mental status change. EXAM: CT HEAD WITHOUT CONTRAST TECHNIQUE: Contiguous axial images were obtained from the base of the skull through the vertex without intravenous contrast. COMPARISON:  None  available. FINDINGS: Brain: Generalized age-related cerebral atrophy with mild chronic small vessel ischemic disease. No acute intracranial hemorrhage. No visible acute large vessel territory infarct. No mass lesion, midline shift or mass effect. No hydrocephalus. No extra-axial fluid collection. Vascular: There is question of asymmetric hyperdensity versus artifact involving a proximal right M2 branch (series 6, image 21). While this is favored to be artifactual, possible thrombus could conceivably have this appearance as well. No other hyperdense vessel. Skull: Scalp soft tissues within normal limits.  Calvarium intact. Sinuses/Orbits: Globes and orbital soft tissues within normal limits. Visualized paranasal sinuses are largely clear. No significant mastoid effusion. Other: None. IMPRESSION: 1. Question asymmetric hyperdensity involving a proximal right M2 branch vessel as above. While this is favored to be artifactual, possible thrombus could also have this appearance. If there is clinical concern for possible developing right MCA territory ischemia, then further evaluation with dedicated CTA would be recommended for further evaluation. 2. No other acute intracranial abnormality. No visible evidence for acute ischemia on this exam. 3. Generalized age-related cerebral atrophy with mild chronic small vessel ischemic disease. Electronically Signed   By: Rise MuBenjamin  McClintock M.D.   On: 03/07/2021 03:42   DG CHEST PORT 1 VIEW  Result Date: 03/06/2021 CLINICAL DATA:  Hypoxia. EXAM: PORTABLE CHEST 1 VIEW COMPARISON:  None. FINDINGS: There is cardiomegaly. There are prominent interstitial lung markings which appear to be new since the recent CT. There is suggestion of a new small right-sided pleural effusion. No focal infiltrate. No pneumothorax. No definite acute osseous abnormality. IMPRESSION: Cardiomegaly with findings concerning for developing volume overload with a small right-sided pleural effusion which is  new from prior CT. Electronically Signed   By: Katherine Mantlehristopher  Green M.D.   On: 03/06/2021 22:49        Scheduled Meds: . apixaban  5 mg Oral BID  . atorvastatin  20 mg Oral Daily  . feeding supplement  237 mL Oral BID BM  . fluticasone  2 spray Each Nare Daily  . loratadine  10 mg Oral Daily  . losartan  50 mg Oral Daily  . metoprolol tartrate  25 mg Oral BID  . sodium chloride  1 g Oral TID WC   Continuous Infusions: . promethazine (PHENERGAN) injection (IM or IVPB)       LOS: 5 days    Time spent: 38 minutes spent on chart review, discussion with nursing staff, consultants, updating family and interview/physical exam; more than 50% of that time was spent in counseling  and/or coordination of care.    Alvira Philips [place], DO [facility] Available via Epic secure chat 7am-7pm After these hours, please refer to coverage provider listed on [email or web address] 03/08/2021, 12:59 PM

## 2021-03-09 ENCOUNTER — Other Ambulatory Visit (HOSPITAL_COMMUNITY): Payer: Self-pay

## 2021-03-09 DIAGNOSIS — I5021 Acute systolic (congestive) heart failure: Secondary | ICD-10-CM

## 2021-03-09 LAB — CBC
HCT: 28.5 % — ABNORMAL LOW (ref 39.0–52.0)
Hemoglobin: 10.4 g/dL — ABNORMAL LOW (ref 13.0–17.0)
MCH: 32.2 pg (ref 26.0–34.0)
MCHC: 36.5 g/dL — ABNORMAL HIGH (ref 30.0–36.0)
MCV: 88.2 fL (ref 80.0–100.0)
Platelets: 90 10*3/uL — ABNORMAL LOW (ref 150–400)
RBC: 3.23 MIL/uL — ABNORMAL LOW (ref 4.22–5.81)
RDW: 12.4 % (ref 11.5–15.5)
WBC: 4 10*3/uL (ref 4.0–10.5)
nRBC: 0 % (ref 0.0–0.2)

## 2021-03-09 LAB — RENAL FUNCTION PANEL
Albumin: 2.6 g/dL — ABNORMAL LOW (ref 3.5–5.0)
Anion gap: 6 (ref 5–15)
BUN: 11 mg/dL (ref 8–23)
CO2: 28 mmol/L (ref 22–32)
Calcium: 7.8 mg/dL — ABNORMAL LOW (ref 8.9–10.3)
Chloride: 91 mmol/L — ABNORMAL LOW (ref 98–111)
Creatinine, Ser: 1.03 mg/dL (ref 0.61–1.24)
GFR, Estimated: >60 mL/min (ref >60)
Glucose, Bld: 85 mg/dL (ref 70–99)
Phosphorus: 2.8 mg/dL (ref 2.5–4.6)
Potassium: 4.1 mmol/L (ref 3.5–5.1)
Sodium: 125 mmol/L — ABNORMAL LOW (ref 135–145)

## 2021-03-09 LAB — MAGNESIUM: Magnesium: 1.5 mg/dL — ABNORMAL LOW (ref 1.7–2.4)

## 2021-03-09 MED ORDER — FUROSEMIDE 20 MG PO TABS
20.0000 mg | ORAL_TABLET | Freq: Two times a day (BID) | ORAL | Status: DC
  Administered 2021-03-09 – 2021-03-11 (×4): 20 mg via ORAL
  Filled 2021-03-09 (×4): qty 1

## 2021-03-09 MED ORDER — MAGNESIUM SULFATE 4 GM/100ML IV SOLN
4.0000 g | Freq: Once | INTRAVENOUS | Status: AC
  Administered 2021-03-09: 4 g via INTRAVENOUS
  Filled 2021-03-09: qty 100

## 2021-03-09 NOTE — Progress Notes (Signed)
Physical Therapy Treatment Patient Details Name: Glen Roy MRN: [id number] DOB: September 18, 1941 Today's Date: 03/09/2021    History of Present Illness Silvia is an 80 y/o male who was sent to ED from urgent care after complaints of emesis, diarrhea, abdominal pain, nausea, and generalized weakness. CT and DG negative for acute findings. Pt admitted on 03/03/21 with hyponatremia. PMH includes anemia and PAF.    PT Comments    Pt's RN requested walking O2 saturation and orthostatic BP measurements with therapy today. Utilized in house interpreter, Aeronautical engineer. Pt's son also in room during session. Pt requires increased direction to patience with SaO2 and BP measurements, however only requires min A for bed mobility, min guard for transfers and light min A for ambulation. Pt able to maintain SaO2 >90%O2 on RA with ambulation. D/c plans remain appropriate at this time. PT will continue to follow acutely.  Orthostatic BPs  Supine 115/54  Sitting 90/61  Sitting after 3 min 122/51  Standing 107/57  After ambulation  105/51     Follow Up Recommendations  Home health PT     Equipment Recommendations  None recommended by PT       Precautions / Restrictions Precautions Precautions: Fall Precaution Comments: HOH Restrictions Weight Bearing Restrictions: No    Mobility  Bed Mobility Overal bed mobility: Needs Assistance Bed Mobility: Supine to Sit     Supine to sit: Min assist     General bed mobility comments: son placed shoes prior to movement, requires minA for pad scoot towards EoB due to pt concern over condom catheter    Transfers Overall transfer level: Needs assistance Equipment used: Rolling walker (2 wheeled) Transfers: Sit to/from Stand Sit to Stand: Min guard         General transfer comment: min guard for safety, vc for hand placement, requires increased time and effort to steady in RW  Ambulation/Gait Ambulation/Gait assistance: Min assist Gait  Distance (Feet): 200 Feet Assistive device: None Gait Pattern/deviations: Step-through pattern;Trunk flexed;Decreased stride length;Narrow base of support Gait velocity: increased speed Gait velocity interpretation: <1.8 ft/sec, indicate of risk for recurrent falls General Gait Details: light min A for safety due to decreased stability and narrow BoS, constant vc for proximity to RW         Balance Overall balance assessment: Needs assistance   Sitting balance-Leahy Scale: Good       Standing balance-Leahy Scale: Fair                              Cognition Arousal/Alertness: Awake/alert Behavior During Therapy: WFL for tasks assessed/performed Overall Cognitive Status: Difficult to assess                                 General Comments: Answers questions appropraitely. difficult to fully assess 2/2 very HOH and language barrier.         General Comments General comments (skin integrity, edema, etc.): Utilized in house interpreter [name] for interpretation, Son present, engaged to push IV for pt with ambulation. Pt on 2.5 L O2 via [place] on entry with SaO2 98%O2, turned off supplemental O2 and SaO2 maintained >90%O2 with ambulation. HR max noted 110bpm      Pertinent Vitals/Pain Pain Assessment: No/denies pain           PT Goals (current goals can now be found in the care plan section) Acute Rehab PT  Goals PT Goal Formulation: With patient Time For Goal Achievement: 03/18/21 Potential to Achieve Goals: Good Progress towards PT goals: Progressing toward goals    Frequency    Min 3X/week      PT Plan Current plan remains appropriate       AM-PAC PT "6 Clicks" Mobility   Outcome Measure  Help needed turning from your back to your side while in a flat bed without using bedrails?: A Little Help needed moving from lying on your back to sitting on the side of a flat bed without using bedrails?: A Little Help needed moving to and from a  bed to a chair (including a wheelchair)?: A Little Help needed standing up from a chair using your arms (e.g., wheelchair or bedside chair)?: A Little Help needed to walk in hospital room?: A Little Help needed climbing 3-5 steps with a railing? : A Little 6 Click Score: 18    End of Session Equipment Utilized During Treatment: Gait belt Activity Tolerance: Patient tolerated treatment well Patient left: with call bell/phone within reach;in chair Nurse Communication: Mobility status PT Visit Diagnosis: Unsteadiness on feet (R26.81);Muscle weakness (generalized) (M62.81)     Time: [id number] PT Time Calculation (min) (ACUTE ONLY): 34 min  Charges:  $Gait Training: 8-22 mins $Therapeutic Activity: 8-22 mins                     Yael Angerer B. Beverely Risen PT, DPT Acute Rehabilitation Services Pager [phone] Office [phone]    Elon Alas [facility] 03/09/2021, 12:57 PM

## 2021-03-09 NOTE — Progress Notes (Addendum)
Progress Note  Patient Name: Glen Roy Date of Encounter: 03/09/2021  [facility] Cardiologist: Donato Schultz, MD   Subjective   Pt fatigued after working with PT  Inpatient Medications    Scheduled Meds: . apixaban  5 mg Oral BID  . atorvastatin  20 mg Oral Daily  . feeding supplement  237 mL Oral BID BM  . fluticasone  2 spray Each Nare Daily  . furosemide  20 mg Oral BID  . loratadine  10 mg Oral Daily  . losartan  50 mg Oral Daily  . metoprolol tartrate  25 mg Oral BID   Continuous Infusions: . magnesium sulfate bolus IVPB 4 g (03/09/21 0905)  . promethazine (PHENERGAN) injection (IM or IVPB)     PRN Meds: acetaminophen **OR** acetaminophen, dicyclomine, guaiFENesin-dextromethorphan, polyethylene glycol, promethazine (PHENERGAN) injection (IM or IVPB), sodium chloride   Vital Signs    Vitals:   03/08/21 1807 03/08/21 2110 03/09/21 0520 03/09/21 0954  BP: 119/73 117/82 122/61 (!) 113/51  Pulse: 66 85 (!) 59 (!) 57  Resp: 16 16 18 18   Temp: 99.5 F (37.5 C) 99.5 F (37.5 C) (!) 97.3 F (36.3 C) 98.4 F (36.9 C)  TempSrc: Oral Oral    SpO2: 100% 99% 100% 100%  Weight:        Intake/Output Summary (Last 24 hours) at 03/09/2021 1050 Last data filed at 03/09/2021 0730 Gross per 24 hour  Intake 780 ml  Output 1750 ml  Net -970 ml   Last 3 Weights 03/05/2021 03/04/2021 03/03/2021  Weight (lbs) 139 lb 15.9 oz 160 lb 15 oz 161 lb 2.5 oz  Weight (kg) 63.5 kg 73 kg 73.1 kg      Telemetry    Atrial fibrillation with HR 70s, pauses up to 2.3 sec - Personally Reviewed  ECG    No new tracings - Personally Reviewed  Physical Exam   GEN: No acute distress.   Neck: No JVD Cardiac: irregular rhythm, regular rate  Respiratory: Clear to auscultation bilaterally. GI: Soft, nontender, non-distended  MS: No edema; No deformity. Neuro:  Nonfocal  Psych: Normal affect   Labs    High Sensitivity Troponin:   Recent Labs  Lab 03/03/21 2045  03/03/21 2136  TROPONINIHS 9 10      Chemistry Recent Labs  Lab 03/03/21 1653 03/04/21 0549 03/07/21 0355 03/08/21 0252 03/09/21 0442  NA 114*   < > 121* 122* 125*  K 3.5   < > 4.5 3.9 4.1  CL 82*   < > 95* 90* 91*  CO2 24   < > 19* 27 28  GLUCOSE 77   < > 72 79 85  BUN 10   < > 5* 6* 11  CREATININE 0.93   < > 0.90 1.11 1.03  CALCIUM 8.1*   < > 7.7* 7.6* 7.8*  PROT 6.5  --   --   --   --   ALBUMIN 3.5  --   --  2.7* 2.6*  AST 61*  --   --   --   --   ALT 20  --   --   --   --   ALKPHOS 45  --   --   --   --   BILITOT 0.7  --   --   --   --   GFRNONAA >60   < > >60 >60 >60  ANIONGAP 8   < > 7 5 6    < > = values in  this interval not displayed.     Hematology Recent Labs  Lab 03/04/21 0549 03/08/21 0252 03/09/21 0442  WBC 3.4* 4.2 4.0  RBC 3.18* 3.43* 3.23*  HGB 10.2* 10.8* 10.4*  HCT 27.2* 29.4* 28.5*  MCV 85.5 85.7 88.2  MCH 32.1 31.5 32.2  MCHC 37.5* 36.7* 36.5*  RDW 11.8 12.2 12.4  PLT 74* 97* 90*    BNP Recent Labs  Lab 03/06/21 0604  BNP 402.7*     DDimer No results for input(s): DDIMER in the last 168 hours.   Radiology    No results found.  Cardiac Studies   Echo 03/05/21: 1. Left ventricular ejection fraction, by estimation, is 35 to 40%. The  left ventricle has moderately decreased function. The left ventricle  demonstrates global hypokinesis. Left ventricular diastolic function could  not be evaluated.  2. Right ventricular systolic function is mildly reduced. The right  ventricular size is normal. There is normal pulmonary artery systolic  pressure. The estimated right ventricular systolic pressure is 30.7 mmHg.  3. Left atrial size was mildly dilated.  4. Right atrial size was mildly dilated.  5. The mitral valve is grossly normal. Mild to moderate mitral valve  regurgitation. No evidence of mitral stenosis.  6. The aortic valve is tricuspid. Aortic valve regurgitation is mild. No  aortic stenosis is present.  7. The  inferior vena cava is normal in size with greater than 50%  respiratory variability, suggesting right atrial pressure of 3 mmHg.   Patient Profile     80 y.o. male with no prior cardiac history just relocated here from [place]. He presented with abdominal pain, constipation, and anorexia, found to have new cardiomyopathy and Afib RVR.   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% - continue lopressor and losartan - diuresing on 20 mg PO lasix BID - appreciate input from nephrology - consider ischemic evaluation as outpatient - advised low sodium diet after Na has been corrected, after discharge   Hyponatremia  - improving - 125 - per nephrology - continue salt tabs   Aortic atherosclerosis - risk factor management - consider ischemic evaluation once recovered 03/06/2021: Cholesterol 156; HDL 42; LDL Cholesterol 108; Triglycerides 29; VLDL 6 - lipitor 20 mg     For questions or updates, please contact [facility] Please consult [email or web address] for contact info under        Signed, Marcelino Duster, PA  03/09/2021, 10:50 AM    Patient seen and examined and agree with Glen Flesher, PA as detailed above.  Exam today conducted with use of a Spanish interpreter.   In brief, the patient is a 80 year old male with no known cardiac history who presented with n/v/diarrhea found to be profoundly hyponatremic with Na 114. 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  Today, the patient states he is feeling better. Tolerating PO. Na improving to 125. Diuresing well on lasix.  GEN: No  acute distress.  Elderly male sitting up in a chair Neck: No JVD Cardiac: Irregularly irregular; no murmurs Respiratory: Clear to auscultation bilaterally.  GI: Soft, nontender, non-distended  MS: No edema; No deformity. Neuro:  Nonfocal  Psych: Normal affect   Plan: -Continue apixaban for AC -Continue metop 25mg  BID for rate control -Continue losartan for now; can switch to entresto prior to discharge -Continue lasix 20mg  PO BID -Management of hyponatremia per renal team -Once recovered from acute illness, will plan for ischemic evaluation as out-patient -Will add GDMT for HFrEF as tolerated going forward  , MD

## 2021-03-09 NOTE — Progress Notes (Signed)
   03/09/21 1010  Clinical Encounter Type  Visited With Other (Comment)  Visit Type Follow-up;Spiritual support  Spiritual Encounters  Spiritual Needs Other (Comment) (Catholic priest)   Chaplain attempted to contact [facility] pastor a second time. Chaplain left a message on Father Jack's cell. If chaplain doesn't hear back, will attempt contacting another parish.   This note was prepared by Chaplain Resident, Tacy Learn, MDiv. Chaplain remains available as needed through the on-call pager: [phone].

## 2021-03-09 NOTE — Progress Notes (Signed)
[facility] NEPHROLOGY PROGRESS NOTE  Assessment/ Plan: Pt is a 80 y.o. yo male  Spanish-speaking with no known significant past medical history admitted on 4/26 for nausea vomiting, decreased oral intake and abdominal pain, seen as a consultation for the evaluation of hyponatremia.  He was also found to have new diagnosis of A. fib, systolic CHF and hypertension.  #Hyponatremia initially felt to be hypovolemic in the setting of nausea vomiting and reduced oral intake.  Now most consistent with hypervolemic hyponatremia.  TSH level acceptable.  Stop Na tablets.  Furosemid 20 BID.  Trend SNa  #Nausea vomiting and constipation: CT scan of abdomen pelvis unremarkable.  He was a started on Linzess and MiraLAX and now having diarrhea.  Discontinued Linzess and on MiraLAX as needed.  #Hypertension: On metoprolol and losartan.  Monitor blood pressure.  #New diagnosis of systolic CHF, seen by cardiologist.  Continue losartan and metoprolol. Lasix as above  #Hypomagnesemia: per primary  #A. fib: On metoprolol and started Eliquis.  #Generalized weakness/deconditioning: PT OT eval and supportive care.  Discussed with the patient's son   Subjective:   No new c/o, SNa up to 25  No SOB, no sig LEE, some faint crakclesi n bases on exam   Objective Vital signs in last 24 hours: Vitals:   03/08/21 1807 03/08/21 2110 03/09/21 0520 03/09/21 0954  BP: 119/73 117/82 122/61 (!) 113/51  Pulse: 66 85 (!) 59 (!) 57  Resp: 16 16 18 18   Temp: 99.5 F (37.5 C) 99.5 F (37.5 C) (!) 97.3 F (36.3 C) 98.4 F (36.9 C)  TempSrc: Oral Oral    SpO2: 100% 99% 100% 100%  Weight:       Weight change:   Intake/Output Summary (Last 24 hours) at 03/09/2021 1401 Last data filed at 03/09/2021 1239 Gross per 24 hour  Intake 960 ml  Output 1750 ml  Net -790 ml       Labs: Basic Metabolic Panel: Recent Labs  Lab 03/07/21 0355 03/08/21 0252 03/09/21 0442  NA 121* 122* 125*  K 4.5  3.9 4.1  CL 95* 90* 91*  CO2 19* 27 28  GLUCOSE 72 79 85  BUN 5* 6* 11  CREATININE 0.90 1.11 1.03  CALCIUM 7.7* 7.6* 7.8*  PHOS  --  1.9* 2.8   Liver Function Tests: Recent Labs  Lab 03/03/21 1653 03/08/21 0252 03/09/21 0442  AST 61*  --   --   ALT 20  --   --   ALKPHOS 45  --   --   BILITOT 0.7  --   --   PROT 6.5  --   --   ALBUMIN 3.5 2.7* 2.6*   Recent Labs  Lab 03/03/21 1653  LIPASE 27   No results for input(s): AMMONIA in the last 168 hours. CBC: Recent Labs  Lab 03/03/21 1653 03/04/21 0549 03/08/21 0252 03/09/21 0442  WBC 4.4 3.4* 4.2 4.0  NEUTROABS 2.5  --   --   --   HGB 11.4* 10.2* 10.8* 10.4*  HCT 30.4* 27.2* 29.4* 28.5*  MCV 84.9 85.5 85.7 88.2  PLT 122* 74* 97* 90*   Cardiac Enzymes: No results for input(s): CKTOTAL, CKMB, CKMBINDEX, TROPONINI in the last 168 hours. CBG: No results for input(s): GLUCAP in the last 168 hours.  Iron Studies: No results for input(s): IRON, TIBC, TRANSFERRIN, FERRITIN in the last 72 hours. Studies/Results: No results found.  Medications: Infusions: . promethazine (PHENERGAN) injection (IM or IVPB)  Scheduled Medications: . apixaban  5 mg Oral BID  . atorvastatin  20 mg Oral Daily  . feeding supplement  237 mL Oral BID BM  . fluticasone  2 spray Each Nare Daily  . furosemide  20 mg Oral BID  . loratadine  10 mg Oral Daily  . losartan  50 mg Oral Daily  . metoprolol tartrate  25 mg Oral BID    have reviewed scheduled and prn medications.  Physical Exam: General: Not in distress, requiring oxygen Heart:RRR, s1s2 nl Lungs: Faint Bibasal crackles, no increased work of breathing Abdomen:soft, Non-tender, non-distended Extremities:no peripheral edema Neurology: Alert awake  Arita Miss 03/09/2021,2:01 PM  LOS: 6 days

## 2021-03-09 NOTE — Plan of Care (Signed)
  Problem: Nutrition: Goal: Adequate nutrition will be maintained Outcome: Progressing   Problem: Elimination: Goal: Will not experience complications related to urinary retention Outcome: Progressing   

## 2021-03-09 NOTE — Progress Notes (Signed)
Occupational Therapy Treatment Patient Details Name: Glen Roy MRN: [id number] DOB: June 27, 1941 Today's Date: 03/09/2021    History of present illness Glen Roy is an 80 y/o male who was sent to ED from urgent care after complaints of emesis, diarrhea, abdominal pain, nausea, and generalized weakness. CT and DG negative for acute findings. Pt admitted on 03/03/21 with hyponatremia. PMH includes anemia and PAF.   OT comments  Pt making progress with functional goals. Session focused on ADL mobility using RW, grooming, LB ADLs and toilet transfers. Pt's son present. OT will continue to follow acutely to maximize level of function and safety  Follow Up Recommendations  Home health OT;Supervision - Intermittent    Equipment Recommendations  None recommended by OT    Recommendations for Other Services      Precautions / Restrictions Precautions Precautions: Fall Precaution Comments: HOH Restrictions Weight Bearing Restrictions: No       Mobility Bed Mobility Overal bed mobility: Needs Assistance Bed Mobility: Supine to Sit;Sit to Supine     Supine to sit: Min assist Sit to supine: Min assist   General bed mobility comments: son placed shoes prior to movement, requires minA for pad scoot towards EoB due to pt concern over condom catheter    Transfers Overall transfer level: Needs assistance Equipment used: Rolling walker (2 wheeled) Transfers: Sit to/from Stand Sit to Stand: Min guard         General transfer comment: min guard for safety, vc for hand placement, requires increased time and effort to steady in RW    Balance Overall balance assessment: Needs assistance Sitting-balance support: No upper extremity supported;Feet supported Sitting balance-Leahy Scale: Good     Standing balance support: Bilateral upper extremity supported;During functional activity Standing balance-Leahy Scale: Fair                             ADL either performed  or assessed with clinical judgement   ADL Overall ADL's : Needs assistance/impaired     Grooming: Wash/dry hands;Wash/dry face;Standing               Lower Body Dressing: Min guard;Sit to/from stand   Toilet Transfer: Min guard;Ambulation;Comfort height toilet   Toileting- Clothing Manipulation and Hygiene: Min guard;Sit to/from stand   Tub/ Shower Transfer: Min guard;Ambulation   Functional mobility during ADLs: Min guard;Rolling walker;Cueing for safety;Cueing for sequencing       Vision Patient Visual Report: No change from baseline     Perception     Praxis      Cognition Arousal/Alertness: Awake/alert Behavior During Therapy: WFL for tasks assessed/performed Overall Cognitive Status: Difficult to assess                                 General Comments: Answers questions appropraitely. difficult to fully assess 2/2 very HOH and language barrier.        Exercises     Shoulder Instructions       General Comments Utilized in house interpreter [name] for interpretation, Son present, engaged to push IV for pt with ambulation. Pt on 2.5 L O2 via [place] on entry with SaO2 98%O2, turned off supplemental O2 and SaO2 maintained >90%O2 with ambulation. HR max noted 110bpm    Pertinent Vitals/ Pain       Pain Assessment: No/denies pain Pain Score: 0-No pain Pain Intervention(s): Monitored during session;Repositioned  Home Living  Prior Functioning/Environment              Frequency  Min 2X/week        Progress Toward Goals  OT Goals(current goals can now be found in the care plan section)  Progress towards OT goals: Progressing toward goals  Acute Rehab OT Goals Patient Stated Goal: not stated  Plan Discharge plan remains appropriate    Co-evaluation                 AM-PAC OT "6 Clicks" Daily Activity     Outcome Measure   Help from another person eating meals?:  None Help from another person taking care of personal grooming?: A Little Help from another person toileting, which includes using toliet, bedpan, or urinal?: A Little Help from another person bathing (including washing, rinsing, drying)?: A Little Help from another person to put on and taking off regular upper body clothing?: None Help from another person to put on and taking off regular lower body clothing?: A Little 6 Click Score: 20    End of Session Equipment Utilized During Treatment: Gait belt;Rolling walker  OT Visit Diagnosis: Unsteadiness on feet (R26.81);Muscle weakness (generalized) (M62.81);Pain   Activity Tolerance Patient limited by fatigue   Patient Left with call bell/phone within reach;with family/visitor present;in bed;with bed alarm set   Nurse Communication          Time: 3762-8315 OT Time Calculation (min): 16 min  Charges: OT General Charges $OT Visit: 1 Visit OT Treatments $Self Care/Home Management : 8-22 mins     Galen Manila 03/09/2021, 2:59 PM

## 2021-03-09 NOTE — Progress Notes (Signed)
PROGRESS NOTE    Glen Roy  XHB:[id number] DOB: 1941-05-05 DOA: 03/03/2021 PCP: Pcp, No    Brief Narrative:  Glen Roy is a 80 year old male with no significant past medical history who presented to the ED on 4/26 with nausea/vomiting, abdominal pain and decreased appetite over the past 4 days.  Additionally, patient reported generalized weakness and overall feeling ill.  Patient reports feeling lightheaded when he stands up with generalized fatigue with any type of activity.  No bowel movement since Thursday.  Recently moved from [place] to [place] to be with family.  No change in dietary habits, no sick contacts.  Denies any raw or undercooked meat ingestion.  Denies headache, no chest pain, no palpitations, no shortness of breath, no cough.  Also denies tobacco, EtOH, illicit drug use.  In the ED, temperature 98.2 F, HR 74, RR 16, BP 140/69, SPO2 100% on room air.  Sodium 114, potassium 3.5, chloride 82, CO2 24, glucose 77, BUN 10, creatinine 0.93, AST 61, ALT 20, total bilirubin 0.7.  Lipase 27.  WBC 4.4, hemoglobin 11.4, platelets 122.  SARS-CoV-2 PCR negative.  Influenza A/B PCR negative.  Urinalysis unrevealing.  X-ray abdomen unrevealing.  CT abdomen/pelvis with contrast with no acute abnormality in the abdomen/pelvis, trace free fluid in the pelvis nonspecific, mild T12 and L2 superior endplate compression fracture and age-indeterminate.   Assessment & Plan:   Principal Problem:   Hyponatremia Active Problems:   Generalized weakness   Constipation   Nausea & vomiting   Prolonged QT interval   AF (paroxysmal atrial fibrillation) (HCC)   Malnutrition of moderate degree   Severe hyponatremia Patient presenting to the ED with progressive weakness, nausea/vomiting and associated abdominal pain.  Poor oral intake in the preceding days.  Patient was found to have a sodium of 114 on admission.  Denies EtOH use.  Serum osmolality low at 242.  Urine  osmolality low at 241, urine sodium less than 10.  Suspect hypovolemic hyponatremia from extrarenal loss secondary to nausea/vomiting and poor oral intake. --Nephrology following, appreciate assistance --Na 114>>117>>119>120>121>122>125 --Lasix 20mg  PO BID --Sodium chloride tablets 1 g p.o. 3 times daily --BMP daily --supportive care  Irritable bowel syndrome with constipation Abdominal x-ray and CT abdomen/pelvis with contrast unrevealing.  Suspect viral illness as patient is afebrile without leukocytosis.  No diarrhea.  No sick contacts.  Patient's son reports that patient used to take regularly medication from called Brumurro de pinaverioo, which is an antispasmodic. --Discontinued Linzess due to multiple bowel movements --Bentyl as needed --Supportive care, antiemetics  Paroxysmal atrial fibrillation, new diagnosis On admission, EKG notable for atrial fibrillation.  Denies past history of arrhythmia.  Currently rate controlled, borderline bradycardic.  --Eliquis 5mg  PO BID --Continue to monitor on telemetry  Systolic congestive heart failure, new diagnosis Cardiomyopathy, unclear etiology TTE with LVEF 35-40%, LV moderately decreased function with global hypokinesis, RV systolic function mildly reduced, LA/RA mildly dilated, mild/moderate MR, IVC normal in size.  BNP 402.7. --Cardiology following, appreciate assistance --Metoprolol tartrate 50 mg p.o. twice daily --Losartan 50 mg p.o. daily --Furosemide 20mg  PO BID --Continues home oxygen, maintain SPO2 greater than 92%, on 2.5 L nasal cannula with SPO2 100%; wean off supplemental oxygen --Ambulatory O2 screening today --Cardiology deferring ischemic evaluation at this time  Aortic atherosclerosis Incidentally noted on CT abdomen/pelvis.  Total cholesterol 156, HDL 42, LDL 108. --atorvastatin 20mg  PO daily  Weakness, deconditioning, gait disturbance --PT/OT recommends home health PT with rolling walker --Continue PT/OT  efforts while inpatient  DVT prophylaxis: Lovenox   Code Status: Full Code Family Communication: Updated patient's son who is present at bedside with aid of video translator.  Disposition Plan:  Level of care: Telemetry Medical Status is: Inpatient  Remains inpatient appropriate because:Ongoing diagnostic testing needed not appropriate for outpatient work up, Unsafe d/c plan, IV treatments appropriate due to intensity of illness or inability to take PO and Inpatient level of care appropriate due to severity of illness   Dispo: The patient is from: Home              Anticipated d/c is to: Home              Patient currently is not medically stable to d/c.   Difficult to place patient No   Consultants:   Cardiology  Nephrology, Dr. Ronalee BeltsBhandari  Procedures:   TTE  Antimicrobials:   None   Subjective: Patient seen and examined at bedside, resting comfortably.  Son present at bedside.  Aided with video translator, [name].  Appetite much improved, tolerated breakfast well this morning without nausea.  Continues without abdominal pain or bloating.  Sodium up to 125 this morning.  No other questions or concerns at this time.  Denies headache, no visual changes, no chest pain, no palpitations, no abdominal pain. No other acute concerns overnight per nursing staff.  Objective: Vitals:   03/08/21 1807 03/08/21 2110 03/09/21 0520 03/09/21 0954  BP: 119/73 117/82 122/61 (!) 113/51  Pulse: 66 85 (!) 59 (!) 57  Resp: 16 16 18 18   Temp: 99.5 F (37.5 C) 99.5 F (37.5 C) (!) 97.3 F (36.3 C) 98.4 F (36.9 C)  TempSrc: Oral Oral    SpO2: 100% 99% 100% 100%  Weight:        Intake/Output Summary (Last 24 hours) at 03/09/2021 1208 Last data filed at 03/09/2021 0730 Gross per 24 hour  Intake 780 ml  Output 1750 ml  Net -970 ml   Filed Weights   03/03/21 2145 03/04/21 2122 03/05/21 2107  Weight: 73.1 kg 73 kg 63.5 kg    Examination:  General exam: Appears calm and  comfortable, weak/debilitated, chronically ill in appearance Respiratory system: Decreased breath sounds bilateral bases with crackles, normal respiratory effort, on 2.5 L nasal cannula with SPO2 100% Cardiovascular system: S1 & S2 heard, irregularly irregular rhythm, normal rate. No JVD, murmurs, rubs, gallops or clicks. No pedal edema. Gastrointestinal system: Abdomen is nondistended, soft and nontender. No organomegaly or masses felt. Normal bowel sounds heard. Central nervous system: Alert and oriented. No focal neurological deficits. Extremities: Symmetric 5 x 5 power. Skin: No rashes, lesions or ulcers Psychiatry: Judgement and insight appear poor. Mood & affect appropriate.     Data Reviewed: I have personally reviewed following labs and imaging studies  CBC: Recent Labs  Lab 03/03/21 1653 03/04/21 0549 03/08/21 0252 03/09/21 0442  WBC 4.4 3.4* 4.2 4.0  NEUTROABS 2.5  --   --   --   HGB 11.4* 10.2* 10.8* 10.4*  HCT 30.4* 27.2* 29.4* 28.5*  MCV 84.9 85.5 85.7 88.2  PLT 122* 74* 97* 90*   Basic Metabolic Panel: Recent Labs  Lab 03/05/21 1157 03/06/21 0604 03/06/21 1456 03/07/21 0355 03/08/21 0252 03/09/21 0442  NA 119* 120* 120* 121* 122* 125*  K 3.5 4.2  --  4.5 3.9 4.1  CL 90* 93*  --  95* 90* 91*  CO2 24 22  --  19* 27 28  GLUCOSE 107* 70  --  72 79  85  BUN 7* <5*  --  5* 6* 11  CREATININE 0.89 0.86  --  0.90 1.11 1.03  CALCIUM 7.4* 7.3*  --  7.7* 7.6* 7.8*  MG  --  1.3*  --  1.6* 1.6* 1.5*  PHOS  --   --   --   --  1.9* 2.8   GFR: CrCl cannot be calculated (Unknown ideal weight.). Liver Function Tests: Recent Labs  Lab 03/03/21 1653 03/08/21 0252 03/09/21 0442  AST 61*  --   --   ALT 20  --   --   ALKPHOS 45  --   --   BILITOT 0.7  --   --   PROT 6.5  --   --   ALBUMIN 3.5 2.7* 2.6*   Recent Labs  Lab 03/03/21 1653  LIPASE 27   No results for input(s): AMMONIA in the last 168 hours. Coagulation Profile: No results for input(s): INR,  PROTIME in the last 168 hours. Cardiac Enzymes: No results for input(s): CKTOTAL, CKMB, CKMBINDEX, TROPONINI in the last 168 hours. BNP (last 3 results) No results for input(s): PROBNP in the last 8760 hours. HbA1C: Recent Labs    03/06/21 1456  HGBA1C 5.1   CBG: No results for input(s): GLUCAP in the last 168 hours. Lipid Profile: No results for input(s): CHOL, HDL, LDLCALC, TRIG, CHOLHDL, LDLDIRECT in the last 72 hours. Thyroid Function Tests: No results for input(s): TSH, T4TOTAL, FREET4, T3FREE, THYROIDAB in the last 72 hours. Anemia Panel: No results for input(s): VITAMINB12, FOLATE, FERRITIN, TIBC, IRON, RETICCTPCT in the last 72 hours. Sepsis Labs: No results for input(s): PROCALCITON, LATICACIDVEN in the last 168 hours.  Recent Results (from the past 240 hour(s))  Resp Panel by RT-PCR (Flu A&B, Covid) Nasopharyngeal Swab     Status: None   Collection Time: 03/03/21  8:20 PM   Specimen: Nasopharyngeal Swab; Nasopharyngeal(NP) swabs in vial transport medium  Result Value Ref Range Status   SARS Coronavirus 2 by RT PCR NEGATIVE NEGATIVE Final    Comment: (NOTE) SARS-CoV-2 target nucleic acids are NOT DETECTED.  The SARS-CoV-2 RNA is generally detectable in upper respiratory specimens during the acute phase of infection. The lowest concentration of SARS-CoV-2 viral copies this assay can detect is 138 copies/mL. A negative result does not preclude SARS-Cov-2 infection and should not be used as the sole basis for treatment or other patient management decisions. A negative result may occur with  improper specimen collection/handling, submission of specimen other than nasopharyngeal swab, presence of viral mutation(s) within the areas targeted by this assay, and inadequate number of viral copies(<138 copies/mL). A negative result must be combined with clinical observations, patient history, and epidemiological information. The expected result is Negative.  Fact Sheet for  Patients:  BloggerCourse.com  Fact Sheet for Healthcare Providers:  SeriousBroker.it  This test is no t yet approved or cleared by the [place] FDA and  has been authorized for detection and/or diagnosis of SARS-CoV-2 by FDA under an Emergency Use Authorization (EUA). This EUA will remain  in effect (meaning this test can be used) for the duration of the COVID-19 declaration under Section 564(b)(1) of the Act, 21 U.S.C.section 360bbb-3(b)(1), unless the authorization is terminated  or revoked sooner.       Influenza A by PCR NEGATIVE NEGATIVE Final   Influenza B by PCR NEGATIVE NEGATIVE Final    Comment: (NOTE) The Xpert Xpress SARS-CoV-2/FLU/RSV plus assay is intended as an aid in the diagnosis of influenza from Nasopharyngeal  swab specimens and should not be used as a sole basis for treatment. Nasal washings and aspirates are unacceptable for Xpert Xpress SARS-CoV-2/FLU/RSV testing.  Fact Sheet for Patients: BloggerCourse.com  Fact Sheet for Healthcare Providers: SeriousBroker.it  This test is not yet approved or cleared by the [place] FDA and has been authorized for detection and/or diagnosis of SARS-CoV-2 by FDA under an Emergency Use Authorization (EUA). This EUA will remain in effect (meaning this test can be used) for the duration of the COVID-19 declaration under Section 564(b)(1) of the Act, 21 U.S.C. section 360bbb-3(b)(1), unless the authorization is terminated or revoked.  Performed at [facility], [address] [place], [place] [address]          Radiology Studies: No results found.      Scheduled Meds: . apixaban  5 mg Oral BID  . atorvastatin  20 mg Oral Daily  . feeding supplement  237 mL Oral BID BM  . fluticasone  2 spray Each Nare Daily  . furosemide  20 mg Oral BID  . loratadine  10 mg Oral Daily  . losartan  50 mg Oral  Daily  . metoprolol tartrate  25 mg Oral BID   Continuous Infusions: . promethazine (PHENERGAN) injection (IM or IVPB)       LOS: 6 days    Time spent: 38 minutes spent on chart review, discussion with nursing staff, consultants, updating family and interview/physical exam; more than 50% of that time was spent in counseling and/or coordination of care.    Alvira Philips [place], DO [facility] Available via Epic secure chat 7am-7pm After these hours, please refer to coverage provider listed on [email or web address] 03/09/2021, 12:08 PM

## 2021-03-10 DIAGNOSIS — I5023 Acute on chronic systolic (congestive) heart failure: Secondary | ICD-10-CM

## 2021-03-10 LAB — BASIC METABOLIC PANEL
Anion gap: 6 (ref 5–15)
BUN: 11 mg/dL (ref 8–23)
CO2: 33 mmol/L — ABNORMAL HIGH (ref 22–32)
Calcium: 8.1 mg/dL — ABNORMAL LOW (ref 8.9–10.3)
Chloride: 90 mmol/L — ABNORMAL LOW (ref 98–111)
Creatinine, Ser: 1.05 mg/dL (ref 0.61–1.24)
GFR, Estimated: >60 mL/min (ref >60)
Glucose, Bld: 79 mg/dL (ref 70–99)
Potassium: 4.3 mmol/L (ref 3.5–5.1)
Sodium: 129 mmol/L — ABNORMAL LOW (ref 135–145)

## 2021-03-10 LAB — CBC
HCT: 30.2 % — ABNORMAL LOW (ref 39.0–52.0)
Hemoglobin: 10.7 g/dL — ABNORMAL LOW (ref 13.0–17.0)
MCH: 31.6 pg (ref 26.0–34.0)
MCHC: 35.4 g/dL (ref 30.0–36.0)
MCV: 89.1 fL (ref 80.0–100.0)
Platelets: 101 10*3/uL — ABNORMAL LOW (ref 150–400)
RBC: 3.39 MIL/uL — ABNORMAL LOW (ref 4.22–5.81)
RDW: 12.8 % (ref 11.5–15.5)
WBC: 3.9 10*3/uL — ABNORMAL LOW (ref 4.0–10.5)
nRBC: 0 % (ref 0.0–0.2)

## 2021-03-10 MED ORDER — SACUBITRIL-VALSARTAN 24-26 MG PO TABS
1.0000 | ORAL_TABLET | Freq: Two times a day (BID) | ORAL | Status: DC
  Administered 2021-03-10 – 2021-03-11 (×2): 1 via ORAL
  Filled 2021-03-10 (×3): qty 1

## 2021-03-10 MED ORDER — SACUBITRIL-VALSARTAN 24-26 MG PO TABS: 1.0000 | ORAL_TABLET | Freq: Two times a day (BID) | ORAL | Status: DC

## 2021-03-10 NOTE — Progress Notes (Addendum)
Progress Note  Patient Name: Glen Roy Date of Encounter: 03/10/2021  [facility] Cardiologist: Donato Schultz, MD   Subjective   Video interpreter used, son is present and helps in his care Pt denies CP or SOB, no palpitaitons  Inpatient Medications    Scheduled Meds: . apixaban  5 mg Oral BID  . atorvastatin  20 mg Oral Daily  . feeding supplement  237 mL Oral BID BM  . fluticasone  2 spray Each Nare Daily  . furosemide  20 mg Oral BID  . loratadine  10 mg Oral Daily  . losartan  50 mg Oral Daily  . metoprolol tartrate  25 mg Oral BID   Continuous Infusions: . promethazine (PHENERGAN) injection (IM or IVPB)     PRN Meds: acetaminophen **OR** acetaminophen, dicyclomine, guaiFENesin-dextromethorphan, polyethylene glycol, promethazine (PHENERGAN) injection (IM or IVPB), sodium chloride   Vital Signs    Vitals:   03/09/21 2100 03/09/21 2143 03/10/21 0409 03/10/21 0918  BP: 121/70 127/64 114/79 (!) 124/55  Pulse: 83 72 (!) 54 62  Resp: 18 16 18 17   Temp: 100.1 F (37.8 C) (!) 97.5 F (36.4 C) 98.2 F (36.8 C) 97.7 F (36.5 C)  TempSrc: Oral Oral Oral Oral  SpO2: 96% 95% 93% 95%  Weight:        Intake/Output Summary (Last 24 hours) at 03/10/2021 1135 Last data filed at 03/10/2021 0800 Gross per 24 hour  Intake 780 ml  Output 1700 ml  Net -920 ml   Last 3 Weights 03/05/2021 03/04/2021 03/03/2021  Weight (lbs) 139 lb 15.9 oz 160 lb 15 oz 161 lb 2.5 oz  Weight (kg) 63.5 kg 73 kg 73.1 kg      Telemetry    Atrial fib, rate 50s at times, mostly while asleep, briefly high 40s, no pauses > 2.5 sec- Personally Reviewed  ECG    No new tracings - Personally Reviewed  Physical Exam   GEN: No acute distress.   Neck: JVD 10 cm Cardiac: Irreg R&R, no murmur, no rubs, or gallops.  Respiratory: diminished to auscultation bilaterally with rales in the bases. GI: Soft, nontender, non-distended  MS: No edema; No deformity. Neuro:  Nonfocal  Psych: Normal  affect   Labs    High Sensitivity Troponin:   Recent Labs  Lab 03/03/21 2045 03/03/21 2136  TROPONINIHS 9 10      Chemistry Recent Labs  Lab 03/03/21 1653 03/04/21 0549 03/08/21 0252 03/09/21 0442 03/10/21 0556  NA 114*   < > 122* 125* 129*  K 3.5   < > 3.9 4.1 4.3  CL 82*   < > 90* 91* 90*  CO2 24   < > 27 28 33*  GLUCOSE 77   < > 79 85 79  BUN 10   < > 6* 11 11  CREATININE 0.93   < > 1.11 1.03 1.05  CALCIUM 8.1*   < > 7.6* 7.8* 8.1*  PROT 6.5  --   --   --   --   ALBUMIN 3.5  --  2.7* 2.6*  --   AST 61*  --   --   --   --   ALT 20  --   --   --   --   ALKPHOS 45  --   --   --   --   BILITOT 0.7  --   --   --   --   GFRNONAA >60   < > >60 >60 >60  ANIONGAP 8   < > 5 6 6    < > = values in this interval not displayed.     Hematology Recent Labs  Lab 03/08/21 0252 03/09/21 0442 03/10/21 0556  WBC 4.2 4.0 3.9*  RBC 3.43* 3.23* 3.39*  HGB 10.8* 10.4* 10.7*  HCT 29.4* 28.5* 30.2*  MCV 85.7 88.2 89.1  MCH 31.5 32.2 31.6  MCHC 36.7* 36.5* 35.4  RDW 12.2 12.4 12.8  PLT 97* 90* 101*    BNP Recent Labs  Lab 03/06/21 0604  BNP 402.7*    Lab Results  Component Value Date   TSH 2.524 03/03/2021   Lab Results  Component Value Date   CHOL 156 03/06/2021   HDL 42 03/06/2021   LDLCALC 108 (H) 03/06/2021   TRIG 29 03/06/2021   CHOLHDL 3.7 03/06/2021   Lab Results  Component Value Date   HGBA1C 5.1 03/06/2021    DDimer No results for input(s): DDIMER in the last 168 hours.   Radiology    No results found.  Cardiac Studies   Echo 03/05/21: 1. Left ventricular ejection fraction, by estimation, is 35 to 40%. The  left ventricle has moderately decreased function. The left ventricle  demonstrates global hypokinesis. Left ventricular diastolic function could  not be evaluated.  2. Right ventricular systolic function is mildly reduced. The right  ventricular size is normal. There is normal pulmonary artery systolic  pressure. The estimated right  ventricular systolic pressure is 30.7 mmHg.  3. Left atrial size was mildly dilated.  4. Right atrial size was mildly dilated.  5. The mitral valve is grossly normal. Mild to moderate mitral valve  regurgitation. No evidence of mitral stenosis.  6. The aortic valve is tricuspid. Aortic valve regurgitation is mild. No  aortic stenosis is present.  7. The inferior vena cava is normal in size with greater than 50%  respiratory variability, suggesting right atrial pressure of 3 mmHg.   Patient Profile     80 y.o. male with no prior cardiac history just relocated here from [place]. He presented with abdominal pain, constipation, and anorexia, found to have new cardiomyopathy and Afib RVR.   Assessment & Plan    Paroxysmal atrial fibrillation Chronic anticoagulation - now persistent atrial fib - in setting of acute abd illness and hyponatremia - on metop 25 mg bid, no doses missed - no sx w/ HR 50s, no dose change - on Eliquis 5 mg bid  New cardiomyopathy - echo this admit w/ EF 35-40%, ez neg MI - Nephrology seeing for low Na+, ok w/ Lasix 20 mg po bid - no ischemic sx, NICM vs ICM. - Eval and its timing to be determined as outpt - continue to follow for sx - BP has limited rx, but is improving - on losartan up-titrated to 50 mg qd, will change to Entresto 24-26 mg bid  Hyponatremia  - 114 on admit, was on salt tabs >> d/c'd by Nephrology - Na up to 129 today, Cl stable at 90  Aortic atherosclerosis - encourage low cholesterol diet -  Discuss ischemic eval as outpt - lipid profile is above, LDL108 - Lipitor 20 mg qd is new     For questions or updates, please contact [facility] Please consult [email or web address] for contact info under        Signed, 97, PA-C  03/10/2021, 11:35 AM    Patient seen and examined and agree with 05/10/2021, PA-C as detailed above.  Exam today conducted with use  of a Spanish interpreter.   In brief, the patient is a 80 year  old male with no known cardiac history who presented with n/v/diarrhea found to be profoundly hyponatremic with Na 114. 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  Today, Na improved to 129. Patient feels better.  GEN: Elderly frail appearing male sitting up in a chair, NAD Neck:No JVD Cardiac:Irregularly irregular; no murmurs Respiratory:Clear to auscultation bilaterally. JJ:OACZ, nontender, non-distended  MS:No edema; No deformity. Neuro:Nonfocal  Psych: Normal affect   Plan: -Continue apixaban for AC -Continue metop 25mg  BID for rate control -Change losartan to entresto 24/26mg  BID -Continue lasix 20mg  PO BID -Management of hyponatremia per renal team -Once recovered from acute illness, will plan for ischemic evaluation as out-patient -Will add GDMT for HFrEF as tolerated going forward as an out-patient--will hold off on spiro and SGLT2i for now until Na recovers back to normal   Cardiology will sign-off. Will arrange for out-patient follow-up for ischemic evaluation and further optimization of HF medications.  , MD

## 2021-03-10 NOTE — Progress Notes (Signed)
[facility] NEPHROLOGY PROGRESS NOTE  Assessment/ Plan: Pt is a 80 y.o. yo male  Spanish-speaking with no known significant past medical history admitted on 4/26 for nausea vomiting, decreased oral intake and abdominal pain, seen as a consultation for the evaluation of hyponatremia.  He was also found to have new diagnosis of A. fib, systolic CHF and hypertension.  #Hyponatremia initially felt to be hypovolemic in the setting of nausea vomiting and reduced oral intake.  Now most consistent with hypervolemic hyponatremia.  TSH level acceptable.  Off Na tablets.  Cont Furosemid 20 BID.  1.2L fluid restriction. Rate of correction is reasonable.  Cont current plan, no further suggestions at this time    #Nausea vomiting and constipation: CT scan of abdomen pelvis unremarkable.  He was a started on Linzess and MiraLAX and now having diarrhea.  Discontinued Linzess and on MiraLAX as needed.  #Hypertension: On metoprolol and entresto.  Monitor blood pressure.  #New diagnosis of systolic CHF, seen by cardiologist.  Continue losartan and metoprolol. Lasix as above  #Hypomagnesemia: per primary  #A. fib: On metoprolol and started Eliquis.  #Generalized weakness/deconditioning: PT OT eval and supportive care.  Discussed with the patient's son.  No further suggestions. Will sign off for now.  Please call with any questions or concerns.  Pt does not need follow up with nephrology and f/u with PCP for hyponatremia.     Subjective:   No new c/o, OOB to chair;  SNa up to 129  Son at bedside  Objective Vital signs in last 24 hours: Vitals:   03/09/21 2100 03/09/21 2143 03/10/21 0409 03/10/21 0918  BP: 121/70 127/64 114/79 (!) 124/55  Pulse: 83 72 (!) 54 62  Resp: 18 16 18 17   Temp: 100.1 F (37.8 C) (!) 97.5 F (36.4 C) 98.2 F (36.8 C) 97.7 F (36.5 C)  TempSrc: Oral Oral Oral Oral  SpO2: 96% 95% 93% 95%  Weight:       Weight change:   Intake/Output Summary (Last 24  hours) at 03/10/2021 1306 Last data filed at 03/10/2021 1244 Gross per 24 hour  Intake 780 ml  Output 1700 ml  Net -920 ml       Labs: Basic Metabolic Panel: Recent Labs  Lab 03/08/21 0252 03/09/21 0442 03/10/21 0556  NA 122* 125* 129*  K 3.9 4.1 4.3  CL 90* 91* 90*  CO2 27 28 33*  GLUCOSE 79 85 79  BUN 6* 11 11  CREATININE 1.11 1.03 1.05  CALCIUM 7.6* 7.8* 8.1*  PHOS 1.9* 2.8  --    Liver Function Tests: Recent Labs  Lab 03/03/21 1653 03/08/21 0252 03/09/21 0442  AST 61*  --   --   ALT 20  --   --   ALKPHOS 45  --   --   BILITOT 0.7  --   --   PROT 6.5  --   --   ALBUMIN 3.5 2.7* 2.6*   Recent Labs  Lab 03/03/21 1653  LIPASE 27   No results for input(s): AMMONIA in the last 168 hours. CBC: Recent Labs  Lab 03/03/21 1653 03/04/21 0549 03/08/21 0252 03/09/21 0442 03/10/21 0556  WBC 4.4 3.4* 4.2 4.0 3.9*  NEUTROABS 2.5  --   --   --   --   HGB 11.4* 10.2* 10.8* 10.4* 10.7*  HCT 30.4* 27.2* 29.4* 28.5* 30.2*  MCV 84.9 85.5 85.7 88.2 89.1  PLT 122* 74* 97* 90* 101*   Cardiac Enzymes: No results for  input(s): CKTOTAL, CKMB, CKMBINDEX, TROPONINI in the last 168 hours. CBG: No results for input(s): GLUCAP in the last 168 hours.  Iron Studies: No results for input(s): IRON, TIBC, TRANSFERRIN, FERRITIN in the last 72 hours. Studies/Results: No results found.  Medications: Infusions: . promethazine (PHENERGAN) injection (IM or IVPB)      Scheduled Medications: . apixaban  5 mg Oral BID  . atorvastatin  20 mg Oral Daily  . feeding supplement  237 mL Oral BID BM  . fluticasone  2 spray Each Nare Daily  . furosemide  20 mg Oral BID  . loratadine  10 mg Oral Daily  . metoprolol tartrate  25 mg Oral BID  . sacubitril-valsartan  1 tablet Oral BID    have reviewed scheduled and prn medications.  Physical Exam: General: Not in distress, requiring oxygen Heart:RRR, s1s2 nl Lungs: Faint Bibasal crackles, no increased work of breathing Abdomen:soft,  Non-tender, non-distended Extremities:no peripheral edema Neurology: Alert awake  Arita Miss 03/10/2021,1:06 PM  LOS: 7 days

## 2021-03-10 NOTE — Progress Notes (Signed)
Physical Therapy Treatment Patient Details Name: Glen Roy MRN: [id number] DOB: [birth year] Today's Date: 03/10/2021    History of Present Illness Glen Roy is an 80 y/o male who was sent to ED from urgent care after complaints of emesis, diarrhea, abdominal pain, nausea, and generalized weakness. CT and DG negative for acute findings. Pt admitted on 03/03/21 with hyponatremia. PMH includes anemia and PAF.    PT Comments    Pt eager to ambulate with therapy today. Utilized in-house interpreter Glen Roy due to pt HoH. Pt son present throughout session and is very attentive to his father. Pt is limited in safe mobility by weakness especially in hips and knees which is contributing to decreased base of support with ambulation and associated deficits in balance. Pt is currently min guard for bed mobility, and transfers. Due to pt independence and preference to no AD use, trialed ambulation without AD, however pt experiences LoB requiring min A for steadying. Pt much steadier with RW, however continued to provide contact guard assist for safety. With return to room utilized handrail in hallway to perform standing exercises to work on improving hip strength. Educated son and pt on need for RW use until pt is stronger despite pt preference to ambulate without it. Son verbalized understanding. D/c plans for HHPT remain appropriate, however pt will need RW for safety. PT will continue to follow acutely.'    Follow Up Recommendations  Home health PT     Equipment Recommendations  Rolling walker with 5" wheels       Precautions / Restrictions Precautions Precautions: Fall Precaution Comments: HOH Restrictions Weight Bearing Restrictions: No    Mobility  Bed Mobility Overal bed mobility: Needs Assistance Bed Mobility: Supine to Sit     Supine to sit: Min guard     General bed mobility comments: son placed shoes prior to movement, min guard for safety, pt son managed condom catheter     Transfers Overall transfer level: Needs assistance Equipment used: None Transfers: Sit to/from Stand Sit to Stand: Min guard         General transfer comment: min guard for safety, good power up, braces LE on EoB to steady, no outside assist  Ambulation/Gait Ambulation/Gait assistance: Min assist Gait Distance (Feet): 200 Feet Assistive device: None Gait Pattern/deviations: Step-through pattern;Trunk flexed;Decreased stride length;Narrow base of support   Gait velocity interpretation: <1.31 ft/sec, indicative of household ambulator General Gait Details: initially ambulated without AD and pt with very narrow BoS, and 1x LoB requiring min A for steadying, transitioned to RW usage with increased saftey despite, pt not wanting to use walker, multimodal cues for proximity to RW, light min A for navigation of RW and saftey      Balance Overall balance assessment: Needs assistance   Sitting balance-Leahy Scale: Good     Standing balance support: Bilateral upper extremity supported;During functional activity;Single extremity supported Standing balance-Leahy Scale: Fair Standing balance comment: able to static stand without assist needs prefers at least single UE support during dynamic activities                            Cognition Arousal/Alertness: Awake/alert Behavior During Therapy: WFL for tasks assessed/performed Overall Cognitive Status: Difficult to assess                                 General Comments: Answers questions appropraitely. difficult to fully assess 2/2  very HOH and language barrier.      Exercises Total Joint Exercises Hip ABduction/ADduction: AROM;Both;10 reps;Standing Standing Hip Extension: AROM;Both;10 reps;Standing    General Comments General comments (skin integrity, edema, etc.): educated pt and son on need for RW usage at home until pt regains his hip strength and is able to maintain wider BoS with ambulation       Pertinent Vitals/Pain Pain Assessment: No/denies pain           PT Goals (current goals can now be found in the care plan section) Acute Rehab PT Goals Patient Stated Goal: go home PT Goal Formulation: With patient Time For Goal Achievement: 03/18/21 Potential to Achieve Goals: Good Progress towards PT goals: Progressing toward goals    Frequency    Min 3X/week      PT Plan Equipment recommendations need to be updated       AM-PAC PT "6 Clicks" Mobility   Outcome Measure  Help needed turning from your back to your side while in a flat bed without using bedrails?: None Help needed moving from lying on your back to sitting on the side of a flat bed without using bedrails?: A Little Help needed moving to and from a bed to a chair (including a wheelchair)?: A Little Help needed standing up from a chair using your arms (e.g., wheelchair or bedside chair)?: A Little Help needed to walk in hospital room?: A Little Help needed climbing 3-5 steps with a railing? : A Little 6 Click Score: 19    End of Session Equipment Utilized During Treatment: Gait belt Activity Tolerance: Patient tolerated treatment well Patient left: with call bell/phone within reach;in chair Nurse Communication: Mobility status PT Visit Diagnosis: Unsteadiness on feet (R26.81);Muscle weakness (generalized) (M62.81)     Time: 1002-1030 PT Time Calculation (min) (ACUTE ONLY): 28 min  Charges:  $Gait Training: 8-22 mins $Therapeutic Exercise: 8-22 mins                     Glen Roy PT, DPT Acute Rehabilitation Services Pager [phone] Office [phone]    Glen Roy Fleet 03/10/2021, 10:46 AM

## 2021-03-10 NOTE — Progress Notes (Signed)
PROGRESS NOTE  Glen Roy  DOB: 07-17-1941  PCP: Pcp, No [id number]  DOA: 03/03/2021  LOS: 7 days   Chief Complaint  Patient presents with  . Emesis   Brief narrative: Glen Roy is a 80 y.o. male with no significant past medical history who presented to the ED on 4/26 with nausea/vomiting, abdominal pain and decreased appetite over the past 4 days.  Additionally, patient reported generalized weakness and overall feeling ill.  Patient reports feeling lightheaded when he stands up with generalized fatigue with any type of activity.   Recently moved from [place] to [place] to be with family.   In the ED, patient was afebrile, hemodynamically stable. Sodium level was significantly 114 with normal renal function.   Urinalysis unrevealing.   X-ray abdomen unrevealing.   CT abdomen/pelvis with contrast with no acute abnormality in the abdomen/pelvis, trace free fluid in the pelvis nonspecific, mild T12 and L2 superior endplate compression fracture and age-indeterminate.  Subjective: Patient was seen and examined this morning. Pleasant elderly Hispanic male.  Sitting up in chair.  Not in distress.  Son at bedside.  Interpreter service used. Chart reviewed.  Assessment/Plan: Severe hyponatremia -Initially felt to be hypovolemic in the setting of nausea, vomiting and reduced oral intake.  Nephrology consultation was obtained.  Seems most consistent with hypervolemic hyponatremia.  Currently on Lasix 20 mg twice daily.   -sodium trend as below.  Recent Labs  Lab 03/04/21 1728 03/04/21 2230 03/05/21 0510 03/05/21 1157 03/06/21 0604 03/06/21 1456 03/07/21 0355 03/08/21 0252 03/09/21 0442 03/10/21 0556  NA 118* 115* 118* 119* 120* 120* 121* 122* 125* 129*   Intractable nausea, vomiting -Improving.  Currently on heart healthy diet.  Irritable bowel syndrome with constipation Abdominal x-ray and CT abdomen/pelvis with contrast unrevealing.   Suspect viral illness as patient is afebrile without leukocytosis.  No diarrhea.  No sick contacts.  Patient's son reports that patient used to take regularly medication from [place] called 'Brumurro de pinaverioo', which is an antispasmodic. -Discontinued Linzess due to multiple bowel movements -Bentyl as needed -Supportive care, antiemetics  Paroxysmal atrial fibrillation, new diagnosis -on admission, EKG notable for atrial fibrillation.  Denies past history of arrhythmia.  Currently rate controlled, borderline bradycardic.  -Has been started on Eliquis 5mg  PO BID -Continue to monitor on telemetry  Systolic congestive heart failure, new diagnosis Cardiomyopathy, unclear etiology -TTE with LVEF 35-40%, LV moderately decreased function with global hypokinesis, RV systolic function mildly reduced, LA/RA mildly dilated, mild/moderate MR, IVC normal in size.  BNP 402.7. -Cardiology following, appreciate assistance -She has been started on metoprolol tartrate 50 mg p.o. twice daily, Losartan 50 mg p.o. daily, Furosemide 20mg  PO BID  Acute respiratory failure with hypoxia -Initially questionable conduction.  Currently on room air. -Wean down as tolerated.  Aortic atherosclerosis -Incidentally noted on CT abdomen/pelvis.   -Total cholesterol 156, HDL 42, LDL 108. -Started on atorvastatin 20mg  PO daily  Weakness, deconditioning, gait disturbance -PT/OT recommends home health PT with rolling walker -Continue PT/OT efforts while inpatient  Mobility: Encourage ambulation.  Home health PT per physical therapy evaluation Code Status:   Code Status: Full Code  Nutritional status: There is no height or weight on file to calculate BMI. Nutrition Problem: Moderate Malnutrition Etiology: chronic illness (CHF, IBS) Signs/Symptoms: moderate fat depletion,moderate muscle depletion Diet Order            Diet Heart Room service appropriate? Yes; Fluid consistency: Thin; Fluid restriction: 1200 mL  Fluid  Diet effective now  DVT prophylaxis:  apixaban (ELIQUIS) tablet 5 mg   Antimicrobials:  None Fluid: None Consultants: Cardiology, nephrology Family Communication:  Son at bedside  Status is: Inpatient  Remains inpatient appropriate because: Needs further sodium level monitoring  Dispo: The patient is from: Home              Anticipated d/c is to: Home with home health PT              Patient currently is not medically stable to d/c.   Difficult to place patient No     Infusions:  . promethazine (PHENERGAN) injection (IM or IVPB)      Scheduled Meds: . apixaban  5 mg Oral BID  . atorvastatin  20 mg Oral Daily  . feeding supplement  237 mL Oral BID BM  . fluticasone  2 spray Each Nare Daily  . furosemide  20 mg Oral BID  . loratadine  10 mg Oral Daily  . losartan  50 mg Oral Daily  . metoprolol tartrate  25 mg Oral BID    Antimicrobials: Anti-infectives (From admission, onward)   None      PRN meds: acetaminophen **OR** acetaminophen, dicyclomine, guaiFENesin-dextromethorphan, polyethylene glycol, promethazine (PHENERGAN) injection (IM or IVPB), sodium chloride   Objective: Vitals:   03/10/21 0409 03/10/21 0918  BP: 114/79 (!) 124/55  Pulse: (!) 54 62  Resp: 18 17  Temp: 98.2 F (36.8 C) 97.7 F (36.5 C)  SpO2: 93% 95%    Intake/Output Summary (Last 24 hours) at 03/10/2021 1153 Last data filed at 03/10/2021 0800 Gross per 24 hour  Intake 780 ml  Output 1700 ml  Net -920 ml   Filed Weights   03/03/21 2145 03/04/21 2122 03/05/21 2107  Weight: 73.1 kg 73 kg 63.5 kg   Weight change:  There is no height or weight on file to calculate BMI.   Physical Exam: General exam: Pleasant, elderly Hispanic male.  Not in distress Skin: No rashes, lesions or ulcers. HEENT: Atraumatic, normocephalic, no obvious bleeding Lungs: Clear to auscultation bilaterally CVS: Regular rate and rhythm, no murmur GI/Abd soft, nontender, nondistended,  bowel sound present CNS: Alert, awake, able to answer questions when asked by son Psychiatry: Mood appropriate Extremities: No pedal edema, no calf tenderness  Data Review: I have personally reviewed the laboratory data and studies available.  Recent Labs  Lab 03/03/21 1653 03/04/21 0549 03/08/21 0252 03/09/21 0442 03/10/21 0556  WBC 4.4 3.4* 4.2 4.0 3.9*  NEUTROABS 2.5  --   --   --   --   HGB 11.4* 10.2* 10.8* 10.4* 10.7*  HCT 30.4* 27.2* 29.4* 28.5* 30.2*  MCV 84.9 85.5 85.7 88.2 89.1  PLT 122* 74* 97* 90* 101*   Recent Labs  Lab 03/06/21 0604 03/06/21 1456 03/07/21 0355 03/08/21 0252 03/09/21 0442 03/10/21 0556  NA 120* 120* 121* 122* 125* 129*  K 4.2  --  4.5 3.9 4.1 4.3  CL 93*  --  95* 90* 91* 90*  CO2 22  --  19* 27 28 33*  GLUCOSE 70  --  72 79 85 79  BUN <5*  --  5* 6* 11 11  CREATININE 0.86  --  0.90 1.11 1.03 1.05  CALCIUM 7.3*  --  7.7* 7.6* 7.8* 8.1*  MG 1.3*  --  1.6* 1.6* 1.5*  --   PHOS  --   --   --  1.9* 2.8  --     F/u labs  Unresulted Labs (From  admission, onward)          Start     Ordered   03/10/21 0500  Basic metabolic panel  Daily,   R      03/09/21 1214   03/08/21 0500  CBC  Daily,   R      03/07/21 1514   03/07/21 0954  Sodium, urine, random  Once,   R        03/07/21 0953   03/07/21 0954  Osmolality, urine  Once,   R        03/07/21 6389          Signed, Lorin Glass, MD [facility] 03/10/2021

## 2021-03-10 NOTE — Plan of Care (Signed)
  Problem: Nutrition: Goal: Adequate nutrition will be maintained Outcome: Progressing   Problem: Elimination: Goal: Will not experience complications related to urinary retention Outcome: Progressing   

## 2021-03-11 ENCOUNTER — Other Ambulatory Visit (HOSPITAL_COMMUNITY): Payer: Self-pay

## 2021-03-11 LAB — CBC
HCT: 33.5 % — ABNORMAL LOW (ref 39.0–52.0)
Hemoglobin: 11.9 g/dL — ABNORMAL LOW (ref 13.0–17.0)
MCH: 31.6 pg (ref 26.0–34.0)
MCHC: 35.5 g/dL (ref 30.0–36.0)
MCV: 89.1 fL (ref 80.0–100.0)
Platelets: 124 10*3/uL — ABNORMAL LOW (ref 150–400)
RBC: 3.76 MIL/uL — ABNORMAL LOW (ref 4.22–5.81)
RDW: 12.5 % (ref 11.5–15.5)
WBC: 4.6 10*3/uL (ref 4.0–10.5)
nRBC: 0 % (ref 0.0–0.2)

## 2021-03-11 LAB — BASIC METABOLIC PANEL
Anion gap: 8 (ref 5–15)
BUN: 17 mg/dL (ref 8–23)
CO2: 33 mmol/L — ABNORMAL HIGH (ref 22–32)
Calcium: 8.5 mg/dL — ABNORMAL LOW (ref 8.9–10.3)
Chloride: 88 mmol/L — ABNORMAL LOW (ref 98–111)
Creatinine, Ser: 1.03 mg/dL (ref 0.61–1.24)
GFR, Estimated: >60 mL/min (ref >60)
Glucose, Bld: 84 mg/dL (ref 70–99)
Potassium: 4 mmol/L (ref 3.5–5.1)
Sodium: 129 mmol/L — ABNORMAL LOW (ref 135–145)

## 2021-03-11 MED ORDER — FUROSEMIDE 20 MG PO TABS
20.0000 mg | ORAL_TABLET | Freq: Two times a day (BID) | ORAL | 2 refills | Status: AC
  Filled 2021-03-11: qty 60, 30d supply, fill #0

## 2021-03-11 MED ORDER — ATORVASTATIN CALCIUM 20 MG PO TABS
20.0000 mg | ORAL_TABLET | Freq: Every day | ORAL | 2 refills | Status: AC
  Filled 2021-03-11: qty 30, 30d supply, fill #0

## 2021-03-11 MED ORDER — SACUBITRIL-VALSARTAN 24-26 MG PO TABS
1.0000 | ORAL_TABLET | Freq: Two times a day (BID) | ORAL | 2 refills | Status: AC
  Filled 2021-03-11: qty 60, 30d supply, fill #0

## 2021-03-11 MED ORDER — METOPROLOL TARTRATE 25 MG PO TABS
25.0000 mg | ORAL_TABLET | Freq: Two times a day (BID) | ORAL | 2 refills | Status: AC
  Filled 2021-03-11: qty 60, 30d supply, fill #0

## 2021-03-11 MED ORDER — APIXABAN 5 MG PO TABS
5.0000 mg | ORAL_TABLET | Freq: Two times a day (BID) | ORAL | 2 refills | Status: AC
  Filled 2021-03-11: qty 60, 30d supply, fill #0

## 2021-03-11 NOTE — Discharge Summary (Signed)
Physician Discharge Summary  Glen Roy QHU:[id number] DOB: Aug 04, 1941 DOA: 03/03/2021  PCP: Pcp, No  Admit date: 03/03/2021 Discharge date: 03/11/2021  Admitted From: Home Discharge disposition: Home with home health PT   Code Status: Full Code  Diet Recommendation: Cardiac diet  Discharge Diagnosis:   Principal Problem:   Hyponatremia Active Problems:   Generalized weakness   Constipation   Nausea & vomiting   Prolonged QT interval   AF (paroxysmal atrial fibrillation) (HCC)   Malnutrition of moderate degree   Chief Complaint  Patient presents with  . Emesis   Brief narrative: Glen Roy is a 80 y.o. male with no significant past medical history who presented to the ED on 4/26 with nausea/vomiting, abdominal pain and decreased appetite over the past 4 days.  Additionally, patient reported generalized weakness and overall feeling ill.  Patient reports feeling lightheaded when he stands up with generalized fatigue with any type of activity.   Recently moved from [place] to [place] to be with family.   In the ED, patient was afebrile, hemodynamically stable. Sodium level was significantly 114 with normal renal function.   Urinalysis unrevealing.   X-ray abdomen unrevealing.   CT abdomen/pelvis with contrast with no acute abnormality in the abdomen/pelvis, trace free fluid in the pelvis nonspecific, mild T12 and L2 superior endplate compression fracture and age-indeterminate.  Subjective: Patient was seen and examined this morning. Pleasant elderly Hispanic male.  Sitting up in chair.  Not in distress.  Feels ready to go home.   Family member at bedside at bedside.  Interpreter service used.  Hospital course: Severe hyponatremia -Initially felt to be hypovolemic in the setting of nausea, vomiting and reduced oral intake.  Nephrology consultation was obtained.  Seems most consistent with hypervolemic hyponatremia related to CHF.  Currently  sodium level is gradually improving on Lasix 20 mg twice daily.   -sodium trend as below.  Recent Labs  Lab 03/04/21 2230 03/05/21 0510 03/05/21 1157 03/06/21 0604 03/06/21 1456 03/07/21 0355 03/08/21 0252 03/09/21 0442 03/10/21 0556 03/11/21 0302  NA 115* 118* 119* 120* 120* 121* 122* 125* 129* 129*   Systolic congestive heart failure, new diagnosis Cardiomyopathy, unclear etiology -TTE with LVEF 35-40%, LV moderately decreased function with global hypokinesis, RV systolic function mildly reduced, LA/RA mildly dilated, mild/moderate MR, IVC normal in size.  BNP 402.7. -Cardiology consult appreciated -She was started on guideline directed medical therapy for heart failure.  Clinically improving.  Per cardiology recommendation, will discharge the patient home today on metoprolol 25 mg twice daily, Entresto 24/26 mg twice daily and Lasix 20 mg twice daily. -Patient will follow up with cardiology as an outpatient.  Needs ischemic evaluation once recovered from acute illness. -Initially required supplemental oxygen.  Glen Roy is a complain currently breathing on room air.  Paroxysmal atrial fibrillation, new diagnosis -on admission, EKG notable for atrial fibrillation.  Denies past history of arrhythmia.  Currently rate controlled on metoprolol. -Has been started on Eliquis 5mg  PO BID -Continue to monitor on telemetry  Irritable bowel syndrome with constipation -Presented with nausea, vomiting. Abdominal x-ray and CT abdomen/pelvis with contrast unrevealing.  Suspect viral illness as patient is afebrile without leukocytosis.  Symptoms resolved.  Aortic atherosclerosis -Incidentally noted on CT abdomen/pelvis.   -Total cholesterol 156, HDL 42, LDL 108. -Started on atorvastatin 20mg  PO daily  Weakness, deconditioning, gait disturbance -PT/OT recommends home health PT with rolling walker.   Wound care:    Discharge Exam:   Vitals:   03/10/21 1831  03/10/21 2030 03/11/21 0435  03/11/21 0928  BP: (!) 121/57 131/79 137/86 119/67  Pulse: 61 79 63 83  Resp: 17 16 17 18   Temp: 97.9 F (36.6 C) 98.3 F (36.8 C) 98.7 F (37.1 C) (!) 97.4 F (36.3 C)  TempSrc:  Oral Oral Oral  SpO2: 97% 94% 95% 94%  Weight:        There is no height or weight on file to calculate BMI.  General exam: Pleasant, elderly Hispanic male.  Sitting up in chair.  Not in distress Skin: No rashes, lesions or ulcers. HEENT: Atraumatic, normocephalic, no obvious bleeding Lungs: Clear to auscultation bilaterally CVS: Regular rate and rhythm, no murmur GI/Abd soft, nontender, nondistended, bowel sound present CNS: Alert, awake, oriented x3 Psychiatry: Mood appropriate Extremities: No pedal edema, no calf tenderness  Follow ups:   Discharge Instructions    Diet - low sodium heart healthy   Complete by: As directed    Increase activity slowly   Complete by: As directed       Follow-up Information    [facility]. Go to.   Why: You are scheduled for a hospital follow up on May 18, 2021 at 08:50 am. Contact information: [place] [place] [phone]       Meriam SpraguePemberton, Heather E, MD Follow up.   Specialties: Cardiology, Radiology Contact information: [address] [place] [address] [phone]               Recommendations for Outpatient Follow-Up:   1. Follow-up with PCP as an outpatient 2. Follow-up with cardiology as an outpatient  Discharge Instructions:  Follow with Primary MD Pcp, No in 7 days   Get CBC/BMP checked in next visit within 1 week by PCP or SNF MD ( we routinely change or add medications that can affect your baseline labs and fluid status, therefore we recommend that you get the mentioned basic workup next visit with your PCP, your PCP may decide not to get them or add new tests based on their clinical decision)  On your next visit with your PCP,  please Get Medicines reviewed and adjusted.  Please request your PCP  to go over all Hospital Tests and Procedure/Radiological results at the follow up, please get all Hospital records sent to your Prim MD by signing hospital release before you go home.  Activity: As tolerated with Full fall precautions use walker/cane & assistance as needed  For Heart failure patients - Check your Weight same time everyday, if you gain over 2 pounds, or you develop in leg swelling, experience more shortness of breath or chest pain, call your Primary MD immediately. Follow Cardiac Low Salt Diet and 1.5 lit/day fluid restriction.  If you have smoked or chewed Tobacco in the last 2 yrs please stop smoking, stop any regular Alcohol  and or any Recreational drug use.  If you experience worsening of your admission symptoms, develop shortness of breath, life threatening emergency, suicidal or homicidal thoughts you must seek medical attention immediately by calling 911 or calling your MD immediately  if symptoms less severe.  You Must read complete instructions/literature along with all the possible adverse reactions/side effects for all the Medicines you take and that have been prescribed to you. Take any new Medicines after you have completely understood and accpet all the possible adverse reactions/side effects.   Do not drive, operate heavy machinery, perform activities at heights, swimming or participation in  water activities or provide baby sitting services if your were admitted for syncope or siezures until you have seen by Primary MD or a Neurologist and advised to do so again.  Do not drive when taking Pain medications.  Do not take more than prescribed Pain, Sleep and Anxiety Medications  Wear Seat belts while driving.   Please note You were cared for by a hospitalist during your hospital stay. If you have any questions about your discharge medications or the care you received while you were in the hospital  after you are discharged, you can call the unit and asked to speak with the hospitalist on call if the hospitalist that took care of you is not available. Once you are discharged, your primary care physician will handle any further medical issues. Please note that NO REFILLS for any discharge medications will be authorized once you are discharged, as it is imperative that you return to your primary care physician (or establish a relationship with a primary care physician if you do not have one) for your aftercare needs so that they can reassess your need for medications and monitor your lab values.    Allergies as of 03/11/2021   No Known Allergies     Medication List    TAKE these medications   apixaban 5 MG Tabs tablet Commonly known as: ELIQUIS Take 1 tablet (5 mg total) by mouth 2 (two) times daily.   atorvastatin 10 MG tablet Commonly known as: LIPITOR Take 2 tablets (20 mg total) by mouth daily. Start taking on: Mar 12, 2021   furosemide 20 MG tablet Commonly known as: LASIX Take 1 tablet (20 mg total) by mouth 2 (two) times daily.   IRON PO Take 1 capsule by mouth every morning.   metoprolol tartrate 25 MG tablet Commonly known as: LOPRESSOR Take 1 tablet (25 mg total) by mouth 2 (two) times daily.   Multivitamin Adult Tabs Take 1 tablet by mouth daily.   sacubitril-valsartan 24-26 MG Commonly known as: ENTRESTO Take 1 tablet by mouth 2 (two) times daily.   sodium chloride 0.65 % Soln nasal spray Commonly known as: OCEAN Place 1 spray into both nostrils as needed for congestion.       Time coordinating discharge: 35 minutes  The results of significant diagnostics from this hospitalization (including imaging, microbiology, ancillary and laboratory) are listed below for reference.    Procedures and Diagnostic Studies:   DG Abd 1 View  Result Date: 03/03/2021 CLINICAL DATA:  Lower abdominal pain, constipation. EXAM: ABDOMEN - 1 VIEW COMPARISON:  None. FINDINGS:  The bowel gas pattern is normal. No radio-opaque calculi or other significant radiographic abnormality are seen. IMPRESSION: Negative. Electronically Signed   By: Lupita Raider M.D.   On: 03/03/2021 14:50   CT ABDOMEN PELVIS W CONTRAST  Result Date: 03/03/2021 CLINICAL DATA:  Abdominal pain with vomiting and diarrhea. EXAM: CT ABDOMEN AND PELVIS WITH CONTRAST TECHNIQUE: Multidetector CT imaging of the abdomen and pelvis was performed using the standard protocol following bolus administration of intravenous contrast. CONTRAST:  OMNIPAQUE IOHEXOL 300 MG/ML  SOLN COMPARISON:  Radiograph earlier today. FINDINGS: Lower chest: Mild cardiomegaly. Mild right lower lobe atelectasis. No confluent consolidation or pleural effusion. Hepatobiliary: Streak artifact from arms down positioning as well as overlying monitoring device. No focal liver lesion is seen. Unremarkable gallbladder. No calcified gallstone or pericholecystic fat stranding. Pancreas: No ductal dilatation or inflammation. Spleen: Normal in size without focal abnormality. Adrenals/Urinary Tract: No adrenal nodule. No  hydronephrosis. Bilateral renal parenchymal thinning. Parapelvic cysts in the left kidney. No suspicious renal lesion. No renal stone. Partially distended urinary bladder, unremarkable. Stomach/Bowel: Bowel evaluation is limited in the absence of enteric contrast, streak artifact from arms down positioning and paucity of intra-abdominal fat. Stomach is grossly normal. There is no bowel dilatation to suggest obstruction. Few fluid-filled loops of small bowel in the pelvis are nondilated or inflamed. Appendix tentatively visualized and normal, though not well evaluated. Air and stool throughout the colon without colonic wall thickening or inflammatory change. Vascular/Lymphatic: Aortic atherosclerosis and tortuosity. No aneurysm. No bulky abdominopelvic adenopathy. Reproductive: Prostate gland not well seen, may be diminutive or absent.  Other: Trace free fluid in the pelvis. No free air. No abdominal wall hernia. Musculoskeletal: Mild T12 and L2 superior endplate compression fractures. Bones are diffusely under mineralized. Sclerotic focus in the right superior pubic ramus. No acute osseous abnormalities. IMPRESSION: 1. No acute abnormality in the abdomen/pelvis. No explanation for symptoms. 2. Trace free fluid in the pelvis is nonspecific, but may be reactive. 3. Mild T12 and L2 superior endplate compression fractures, age indeterminate. Aortic Atherosclerosis (ICD10-I70.0). Electronically Signed   By: Narda Rutherford M.D.   On: 03/03/2021 19:40     Labs:   Basic Metabolic Panel: Recent Labs  Lab 03/06/21 0604 03/06/21 1456 03/07/21 0355 03/08/21 0252 03/09/21 0442 03/10/21 0556 03/11/21 0302  NA 120*   < > 121* 122* 125* 129* 129*  K 4.2  --  4.5 3.9 4.1 4.3 4.0  CL 93*  --  95* 90* 91* 90* 88*  CO2 22  --  19* 27 28 33* 33*  GLUCOSE 70  --  72 79 85 79 84  BUN <5*  --  5* 6* CREATININE 0.86  --  0.90 1.11 1.03 1.05 1.03  CALCIUM 7.3*  --  7.7* 7.6* 7.8* 8.1* 8.5*  MG 1.3*  --  1.6* 1.6* 1.5*  --   --   PHOS  --   --   --  1.9* 2.8  --   --    < > = values in this interval not displayed.   GFR CrCl cannot be calculated (Unknown ideal weight.). Liver Function Tests: Recent Labs  Lab 03/08/21 0252 03/09/21 0442  ALBUMIN 2.7* 2.6*   No results for input(s): LIPASE, AMYLASE in the last 168 hours. No results for input(s): AMMONIA in the last 168 hours. Coagulation profile No results for input(s): INR, PROTIME in the last 168 hours.  CBC: Recent Labs  Lab 03/08/21 0252 03/09/21 0442 03/10/21 0556 03/11/21 0302  WBC 4.2 4.0 3.9* 4.6  HGB 10.8* 10.4* 10.7* 11.9*  HCT 29.4* 28.5* 30.2* 33.5*  MCV 85.7 88.2 89.1 89.1  PLT 97* 90* 101* 124*   Cardiac Enzymes: No results for input(s): CKTOTAL, CKMB, CKMBINDEX, TROPONINI in the last 168 hours. BNP: Invalid input(s): POCBNP CBG: No results  for input(s): GLUCAP in the last 168 hours. D-Dimer No results for input(s): DDIMER in the last 72 hours. Hgb A1c No results for input(s): HGBA1C in the last 72 hours. Lipid Profile No results for input(s): CHOL, HDL, LDLCALC, TRIG, CHOLHDL, LDLDIRECT in the last 72 hours. Thyroid function studies No results for input(s): TSH, T4TOTAL, T3FREE, THYROIDAB in the last 72 hours.  Invalid input(s): FREET3 Anemia work up No results for input(s): VITAMINB12, FOLATE, FERRITIN, TIBC, IRON, RETICCTPCT in the last 72 hours. Microbiology Recent Results (from the past 240 hour(s))  Resp Panel by RT-PCR (Flu  A&B, Covid) Nasopharyngeal Swab     Status: None   Collection Time: 03/03/21  8:20 PM   Specimen: Nasopharyngeal Swab; Nasopharyngeal(NP) swabs in vial transport medium  Result Value Ref Range Status   SARS Coronavirus 2 by RT PCR NEGATIVE NEGATIVE Final    Comment: (NOTE) SARS-CoV-2 target nucleic acids are NOT DETECTED.  The SARS-CoV-2 RNA is generally detectable in upper respiratory specimens during the acute phase of infection. The lowest concentration of SARS-CoV-2 viral copies this assay can detect is 138 copies/mL. A negative result does not preclude SARS-Cov-2 infection and should not be used as the sole basis for treatment or other patient management decisions. A negative result may occur with  improper specimen collection/handling, submission of specimen other than nasopharyngeal swab, presence of viral mutation(s) within the areas targeted by this assay, and inadequate number of viral copies(<138 copies/mL). A negative result must be combined with clinical observations, patient history, and epidemiological information. The expected result is Negative.  Fact Sheet for Patients:  BloggerCourse.com  Fact Sheet for Healthcare Providers:  SeriousBroker.it  This test is no t yet approved or cleared by the [place] FDA and   has been authorized for detection and/or diagnosis of SARS-CoV-2 by FDA under an Emergency Use Authorization (EUA). This EUA will remain  in effect (meaning this test can be used) for the duration of the COVID-19 declaration under Section 564(b)(1) of the Act, 21 U.S.C.section 360bbb-3(b)(1), unless the authorization is terminated  or revoked sooner.       Influenza A by PCR NEGATIVE NEGATIVE Final   Influenza B by PCR NEGATIVE NEGATIVE Final    Comment: (NOTE) The Xpert Xpress SARS-CoV-2/FLU/RSV plus assay is intended as an aid in the diagnosis of influenza from Nasopharyngeal swab specimens and should not be used as a sole basis for treatment. Nasal washings and aspirates are unacceptable for Xpert Xpress SARS-CoV-2/FLU/RSV testing.  Fact Sheet for Patients: BloggerCourse.com  Fact Sheet for Healthcare Providers: SeriousBroker.it  This test is not yet approved or cleared by the [place] FDA and has been authorized for detection and/or diagnosis of SARS-CoV-2 by FDA under an Emergency Use Authorization (EUA). This EUA will remain in effect (meaning this test can be used) for the duration of the COVID-19 declaration under Section 564(b)(1) of the Act, 21 U.S.C. section 360bbb-3(b)(1), unless the authorization is terminated or revoked.  Performed at [facility], [address] [place], [place] [address]      Signed: Lorin Glass  [facility] 03/11/2021, 10:35 AM

## 2021-03-11 NOTE — TOC Progression Note (Signed)
Transition of Care [facility]) - Progression Note    Patient Details  Name: Glen Roy MRN: [id number] Date of Birth: 1941/07/09  [facility]) CM/SW Contact  Janae Bridgeman, RN Phone Number: 03/11/2021, 1:59 PM  Clinical Narrative:    Case management spoke with the patient's son at the bedside for discharge instructions and follow up visits.  The patient lives with the son in [place], [place] and plans to stay there with him rather than returning to [place].  The Spanish interpretor was used at the bedside to assist with communication.  The patient's TOC medications were delivered and present at the bedside.  A MATCH was placed but may have been filled yesterday.   The patient's son plans on purchasing dme from the pharmacy.  I spoke with the patient's son and recommended a weekly medication planner to help keep track on patient's medications at home.  The patient was set up with outpatient physical therapy and occupational therapy at the [facility] for therapy follow up.  The patient can be discharged home with family today.     Expected Discharge Plan: Home/Self Care Barriers to Discharge: Inadequate or no insurance,Continued Medical Work up  Expected Discharge Plan and Services Expected Discharge Plan: Home/Self Care In-house Referral: PCP / [facility],Interpreting Services Discharge Planning Services: CM Consult,Medication Assistance,Follow-up appt scheduled,Indigent Health Clinic   Living arrangements for the past 2 months: Mobile Home (visiting and staying with family in [place] for 1 month and then returning home to [place]) Expected Discharge Date: 03/11/21                                     Social Determinants of Health (SDOH) Interventions    Readmission Risk Interventions Readmission Risk Prevention Plan 03/04/2021  Post Dischage Appt Complete  Medication Screening Complete  Transportation Screening Complete

## 2021-04-01 ENCOUNTER — Other Ambulatory Visit (HOSPITAL_COMMUNITY): Payer: Self-pay

## 2021-04-13 ENCOUNTER — Encounter: Payer: Self-pay | Admitting: Physician Assistant

## 2021-04-13 NOTE — Progress Notes (Deleted)
Cardiology Office Note    Date:  04/15/2021   ID:  Glen Roy, DOB [birth year], MRN [id number]  PCP:  Pcp, No  Cardiologist:  Donato Schultz, MD  Electrophysiologist:  None   Chief Complaint: ***  History of Present Illness:   Glen Roy is a 80 y.o. 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 He was admitted to our hospital system in late April/early May 2022 with nausea, vomiting and reduced oral intake. He was found to be severely hyponatremic to 114 with newly recognized systolic CHF (EF 63-84%) and persistent atrial fib. 2D echo showed EF 35-40%, mild BAE, trivial pericardial effusion, mild-moderate MR. He was started on Eliquis, metoprolol, Entresto, Lasix, and statin. Cardioversion not pursued due to adequate rate control. Ischemic evaluation deferred in acute setting. 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 Ruled out active pulmonary TB with AFB sputum x 3 but given high index of suspicion, ID recommended f/u at [facility] health dept to begin empiric treatment while awaiting culture results. Not amenable to IR-guided biopsy; if TB workup negative will  likely need interventional pulm f/u and PET/CT at [facility] Dr. Lucia Estelle guidance appreciated. Referral placed for pulmonary follow up."  He followed up with ID as an outpatient 04/02/21, notes available in CareEverywhere. There was some concern that the patient had had an actual TB exposure. However, per their addendum, "Spoke with patient's son Glen Roy), his father has not had a contact with TB (the reported contact in the chart from his hospitalization was incorrectly given to staff by the son and his father clarified this with him). Glen Roy feels his father has been improving since discharge but continues to cough at night. At this point, I do not feel empiric therapy is warranted for TB and further evaluation/ imaging should be performed first. 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 Acute kidney injury (HCC) 03/2021  . Anemia   . Aortic atherosclerosis (HCC)   . Cavitary lesion of lung   . Chronic systolic CHF (congestive heart failure) (HCC)   . COVID-19 virus infection 03/2021  . Gastritis   . IBS (irritable bowel syndrome)   . Persistent atrial fibrillation (HCC)   . Pulmonary emboli (HCC)     No past surgical history on file.  Current Medications: No outpatient medications have been marked as taking for the 04/15/21 encounter (Appointment) with Ronie Spies  N, PA-C.   ***   Allergies:   Patient has no known allergies.   Social History   Socioeconomic History  . Marital status: Married     Spouse name: Not on file  . Number of children: Not on file  . Years of education: Not on file  . Highest education level: Not on file  Occupational History  . Not on file  Tobacco Use  . Smoking status: Never Smoker  . Smokeless tobacco: Never Used  Substance and Sexual Activity  . Alcohol use: Not Currently  . Drug use: Never  . Sexual activity: Not on file  Other Topics Concern  . Not on file  Social History Narrative  . Not on file   Social Determinants of Health   Financial Resource Strain: Not on file  Food Insecurity: Not on file  Transportation Needs: Not on file  Physical Activity: Not on file  Stress: Not on file  Social Connections: Not on file     Family History:  The patient's ***Family history is unknown by patient.  ROS:   Please see the history of present illness. Otherwise, review of systems is positive for ***.  All other systems are reviewed and otherwise negative.    EKGs/Labs/Other Studies Reviewed:    Studies reviewed are outlined and summarized above. Reports included below if pertinent.  2d echo 03/05/21 1. Left ventricular ejection fraction, by estimation, is 35 to 40%. The  left ventricle has moderately decreased function. The left ventricle  demonstrates global hypokinesis. Left ventricular diastolic function could  not be evaluated.  2. Right ventricular systolic function is mildly reduced. The right  ventricular size is normal. There is normal pulmonary artery systolic  pressure. The estimated right ventricular systolic pressure is 30.7 mmHg.  3. Left atrial size was mildly dilated.  4. Right atrial size was mildly dilated.  5. The mitral valve is grossly normal. Mild to moderate mitral valve  regurgitation. No evidence of mitral stenosis.  6. The aortic valve is tricuspid. Aortic valve regurgitation is mild. No  aortic stenosis is present.  7. The inferior vena cava is normal in size with greater than 50%  respiratory  variability, suggesting right atrial pressure of 3 mmHg.     EKG:  EKG is ordered today, personally reviewed, demonstrating ***  Recent Labs: 03/03/2021: ALT 20; TSH 2.524 03/06/2021: B Natriuretic Peptide 402.7 03/09/2021: Magnesium 1.5 03/11/2021: BUN 17; Creatinine, Ser 1.03; Hemoglobin 11.9; Platelets 124; Potassium 4.0; Sodium 129  Recent Lipid Panel    Component Value Date/Time   CHOL 156 03/06/2021 0604   TRIG 29 03/06/2021 0604   HDL 42 03/06/2021 0604   CHOLHDL 3.7 03/06/2021 0604   VLDL 6 03/06/2021 0604   LDLCALC 108 (H) 03/06/2021 0604    PHYSICAL EXAM:    VS:  There were no vitals taken for this visit.  BMI: There is no height or weight on file to calculate BMI.  GEN: Well nourished, well developed male in no acute distress HEENT: normocephalic, atraumatic Neck: no JVD, carotid bruits, or masses Cardiac: ***RRR; no murmurs, rubs, or gallops, no edema  Respiratory:  clear to auscultation bilaterally, normal work of breathing GI: soft, nontender, nondistended, + BS MS: no deformity or atrophy Skin: warm and dry, no rash Neuro:  Alert and Oriented x 3, Strength and sensation are intact, follows commands Psych: euthymic mood, full affect  Wt Readings from Last 3 Encounters:  03/05/21 139 lb 15.9 oz (63.5 kg)  ASSESSMENT & PLAN:   1. ***  Disposition: F/u with ***   Medication Adjustments/Labs and Tests Ordered: Current medicines are reviewed at length with the patient today.  Concerns regarding medicines are outlined above. Medication changes, Labs and Tests ordered today are summarized above and listed in the Patient Instructions accessible in Encounters.   Signed, Laurann Montana, PA-C  04/15/2021 7:49 AM    [facility] [address] [place], [place], [place]  [address] Phone: [phone]; Fax: [phone]

## 2021-04-15 ENCOUNTER — Ambulatory Visit: Payer: Self-pay | Admitting: Physician Assistant

## 2021-04-15 DIAGNOSIS — I5022 Chronic systolic (congestive) heart failure: Secondary | ICD-10-CM

## 2021-04-15 DIAGNOSIS — I340 Nonrheumatic mitral (valve) insufficiency: Secondary | ICD-10-CM

## 2021-04-15 DIAGNOSIS — I480 Paroxysmal atrial fibrillation: Secondary | ICD-10-CM

## 2021-04-15 DIAGNOSIS — J984 Other disorders of lung: Secondary | ICD-10-CM

## 2021-04-15 DIAGNOSIS — I2699 Other pulmonary embolism without acute cor pulmonale: Secondary | ICD-10-CM

## 2021-04-15 DIAGNOSIS — D649 Anemia, unspecified: Secondary | ICD-10-CM

## 2021-04-20 ENCOUNTER — Inpatient Hospital Stay: Payer: Self-pay | Admitting: Internal Medicine

## 2021-05-18 ENCOUNTER — Ambulatory Visit (INDEPENDENT_AMBULATORY_CARE_PROVIDER_SITE_OTHER): Payer: Self-pay | Admitting: Primary Care

## 2021-12-04 IMAGING — CT CT ABD-PELV W/ CM
2 of 5 series · 16 of 46 positions shown, 18 images · IV contrast (Omni 300)
Comparison: Radiograph earlier today.

CLINICAL DATA: Abdominal pain with vomiting and diarrhea.

EXAM:
CT ABDOMEN AND PELVIS WITH CONTRAST
TECHNIQUE: Multidetector CT imaging of the abdomen and pelvis was performed
using the standard protocol following bolus administration of
intravenous contrast.
CONTRAST:  100mL OMNIPAQUE IOHEXOL 300 MG/ML  SOLN

[Series 3: a/p w/ 5mm · axial · 0.86mm/px · z∈[+865,+1245]mm · 13 of 86 slices shown, 15 images]
[im 5/86  soft-tissue]
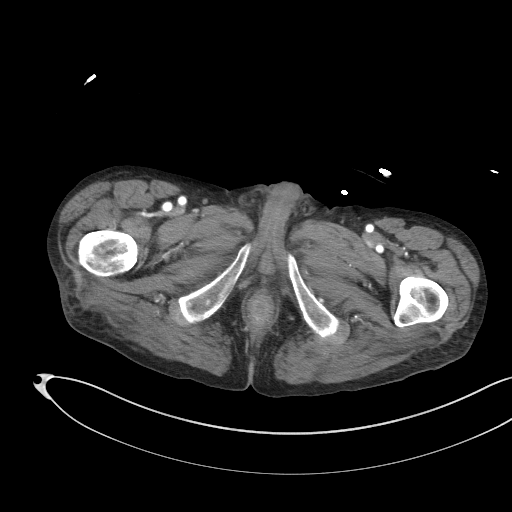
[im 5/86  bone]
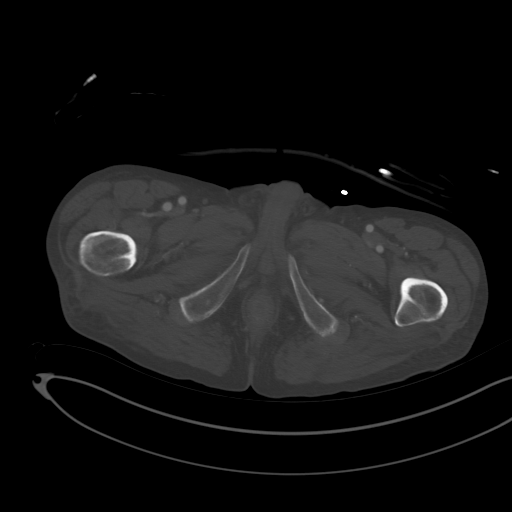
[im 13/86  soft-tissue]
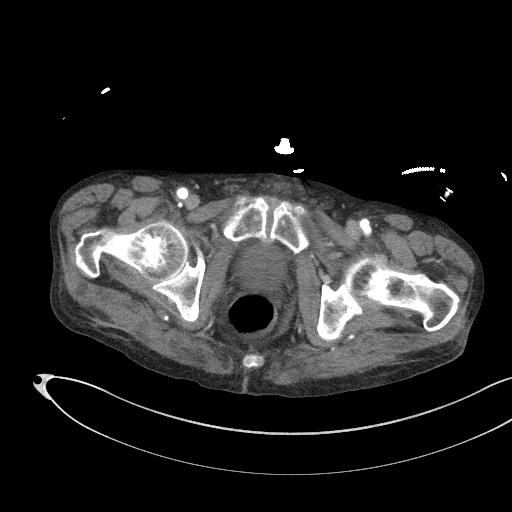
[im 18/86  soft-tissue]
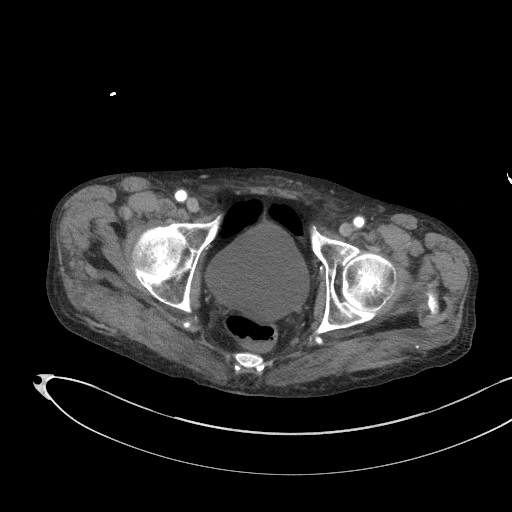
[im 26/86  soft-tissue]
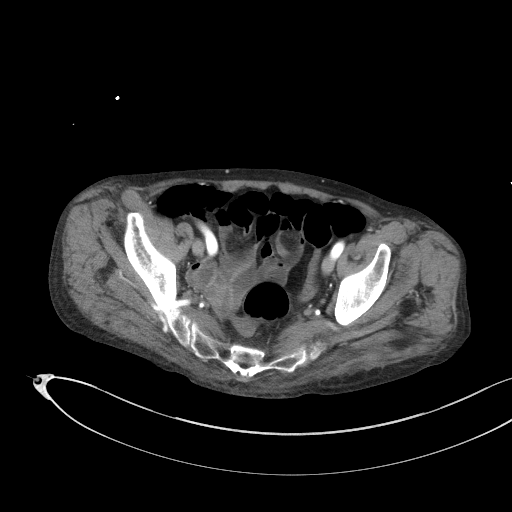
[im 30/86  soft-tissue]
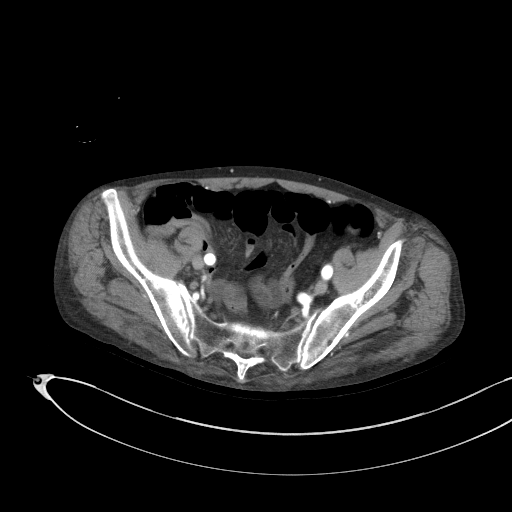
[im 39/86  soft-tissue]
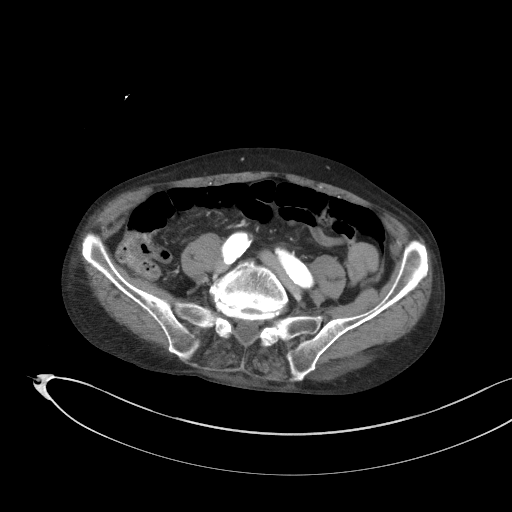
[im 43/86  soft-tissue]
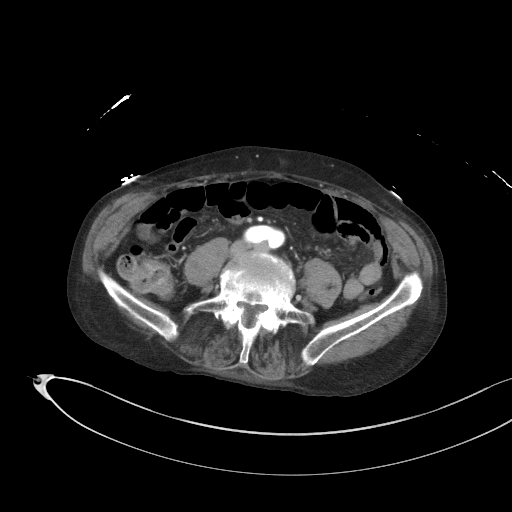
[im 47/86  soft-tissue]
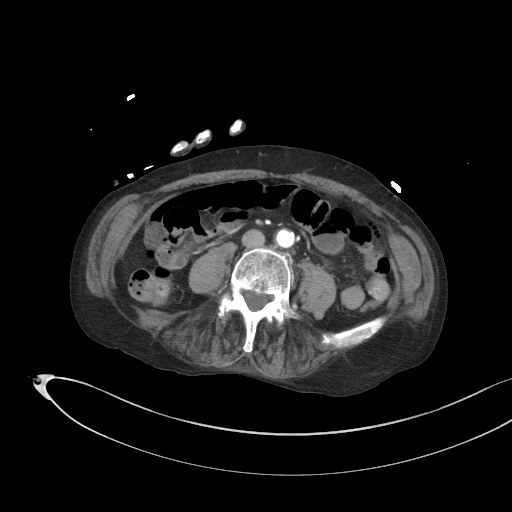
[im 56/86  soft-tissue]
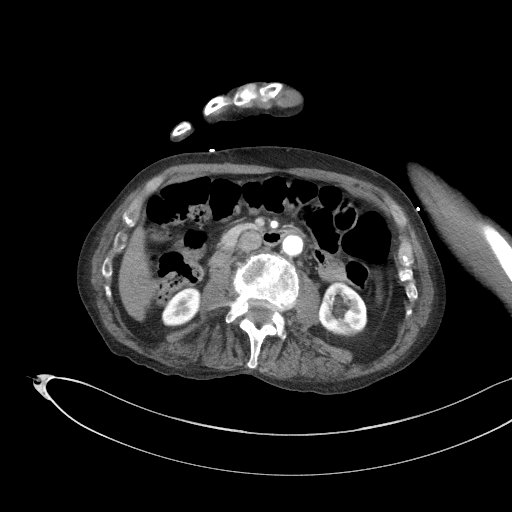
[im 56/86  bone]
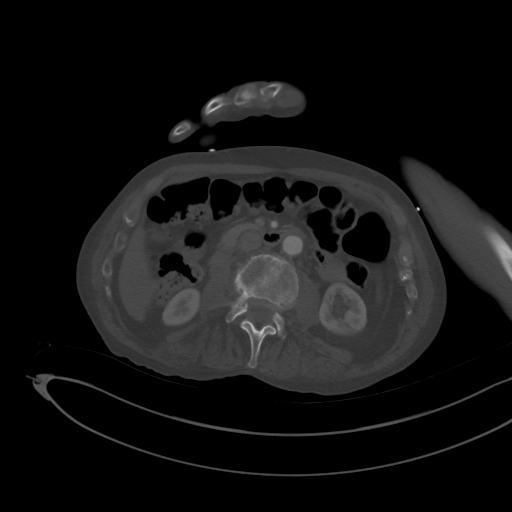
[im 60/86  soft-tissue]
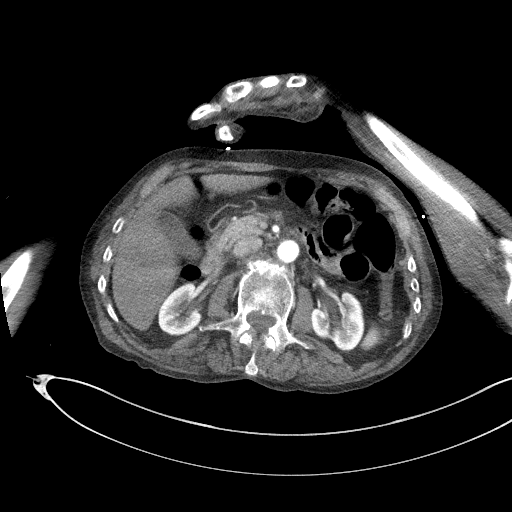
[im 69/86  soft-tissue]
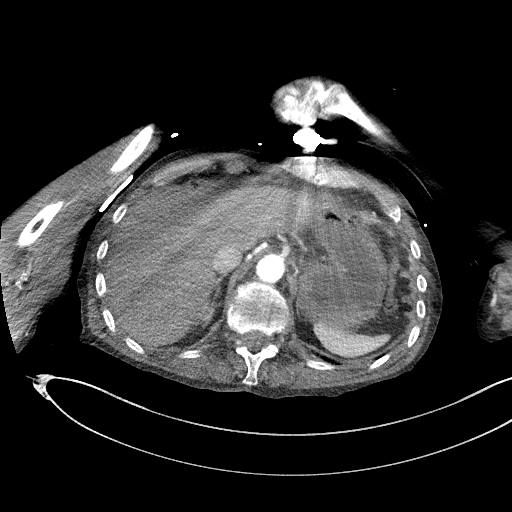
[im 73/86  soft-tissue]
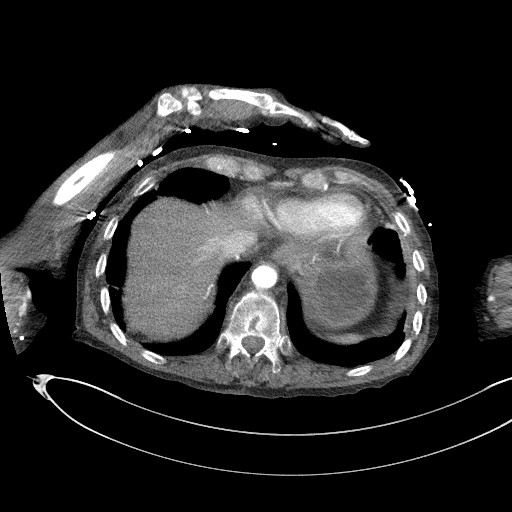
[im 81/86  soft-tissue]
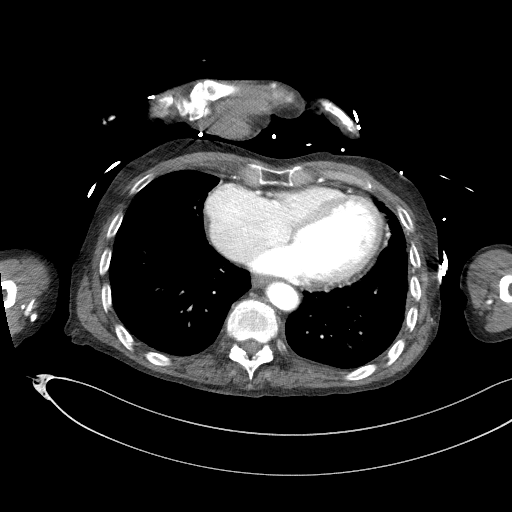

[Series 6: a/p w/ cor · coronal · 0.76mm/px · 3 of 121 slices shown]
[im 41/121  soft-tissue]
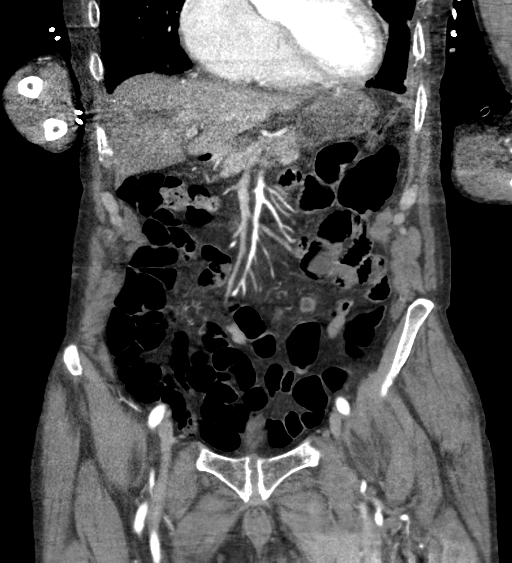
[im 54/121  soft-tissue]
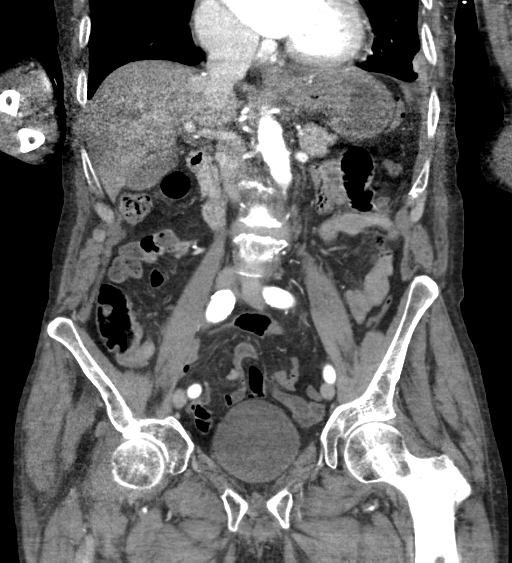
[im 67/121  soft-tissue]
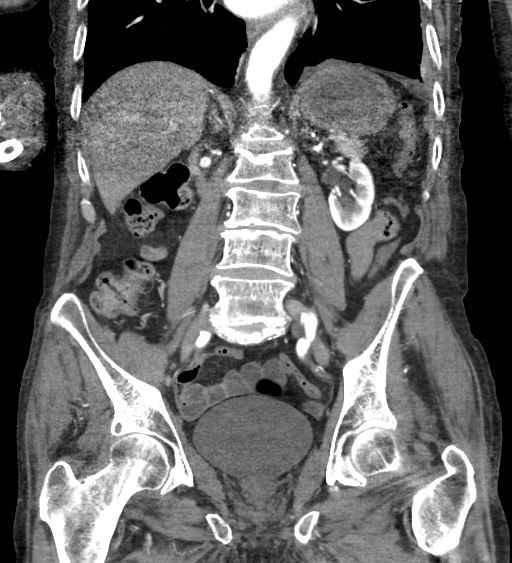

[16 of 46 positions shown; findings below may reference images not displayed]

FINDINGS: Lower chest: Mild cardiomegaly. Mild right lower lobe atelectasis.
No confluent consolidation or pleural effusion.

Hepatobiliary: Streak artifact from arms down positioning as well as
overlying monitoring device. No focal liver lesion is seen.
Unremarkable gallbladder. No calcified gallstone or pericholecystic
fat stranding.

Pancreas: No ductal dilatation or inflammation.

Spleen: Normal in size without focal abnormality.

Adrenals/Urinary Tract: No adrenal nodule. No hydronephrosis.
Bilateral renal parenchymal thinning. Parapelvic cysts in the left
kidney. No suspicious renal lesion. No renal stone. Partially
distended urinary bladder, unremarkable.

Stomach/Bowel: Bowel evaluation is limited in the absence of enteric
contrast, streak artifact from arms down positioning and paucity of
intra-abdominal fat. Stomach is grossly normal. There is no bowel
dilatation to suggest obstruction. Few fluid-filled loops of small
bowel in the pelvis are nondilated or inflamed. Appendix tentatively
visualized and normal, though not well evaluated. Air and stool
throughout the colon without colonic wall thickening or inflammatory
change.

Vascular/Lymphatic: Aortic atherosclerosis and tortuosity. No
aneurysm. No bulky abdominopelvic adenopathy.

Reproductive: Prostate gland not well seen, may be diminutive or
absent.

Other: Trace free fluid in the pelvis. No free air. No abdominal
wall hernia.

Musculoskeletal: Mild T12 and L2 superior endplate compression
fractures. Bones are diffusely under mineralized. Sclerotic focus in
the right superior pubic ramus. No acute osseous abnormalities.
IMPRESSION: 1. No acute abnormality in the abdomen/pelvis. No explanation for
symptoms.
2. Trace free fluid in the pelvis is nonspecific, but may be
reactive.
3. Mild T12 and L2 superior endplate compression fractures, age
indeterminate.

Aortic Atherosclerosis ([id number]-[id number].B).

## 2021-12-07 IMAGING — DX DG CHEST 1V PORT
1 series · 1 of 1 positions shown · non-contrast
Comparison: None.

CLINICAL DATA: Hypoxia.

EXAM:
PORTABLE CHEST 1 VIEW

[chest]
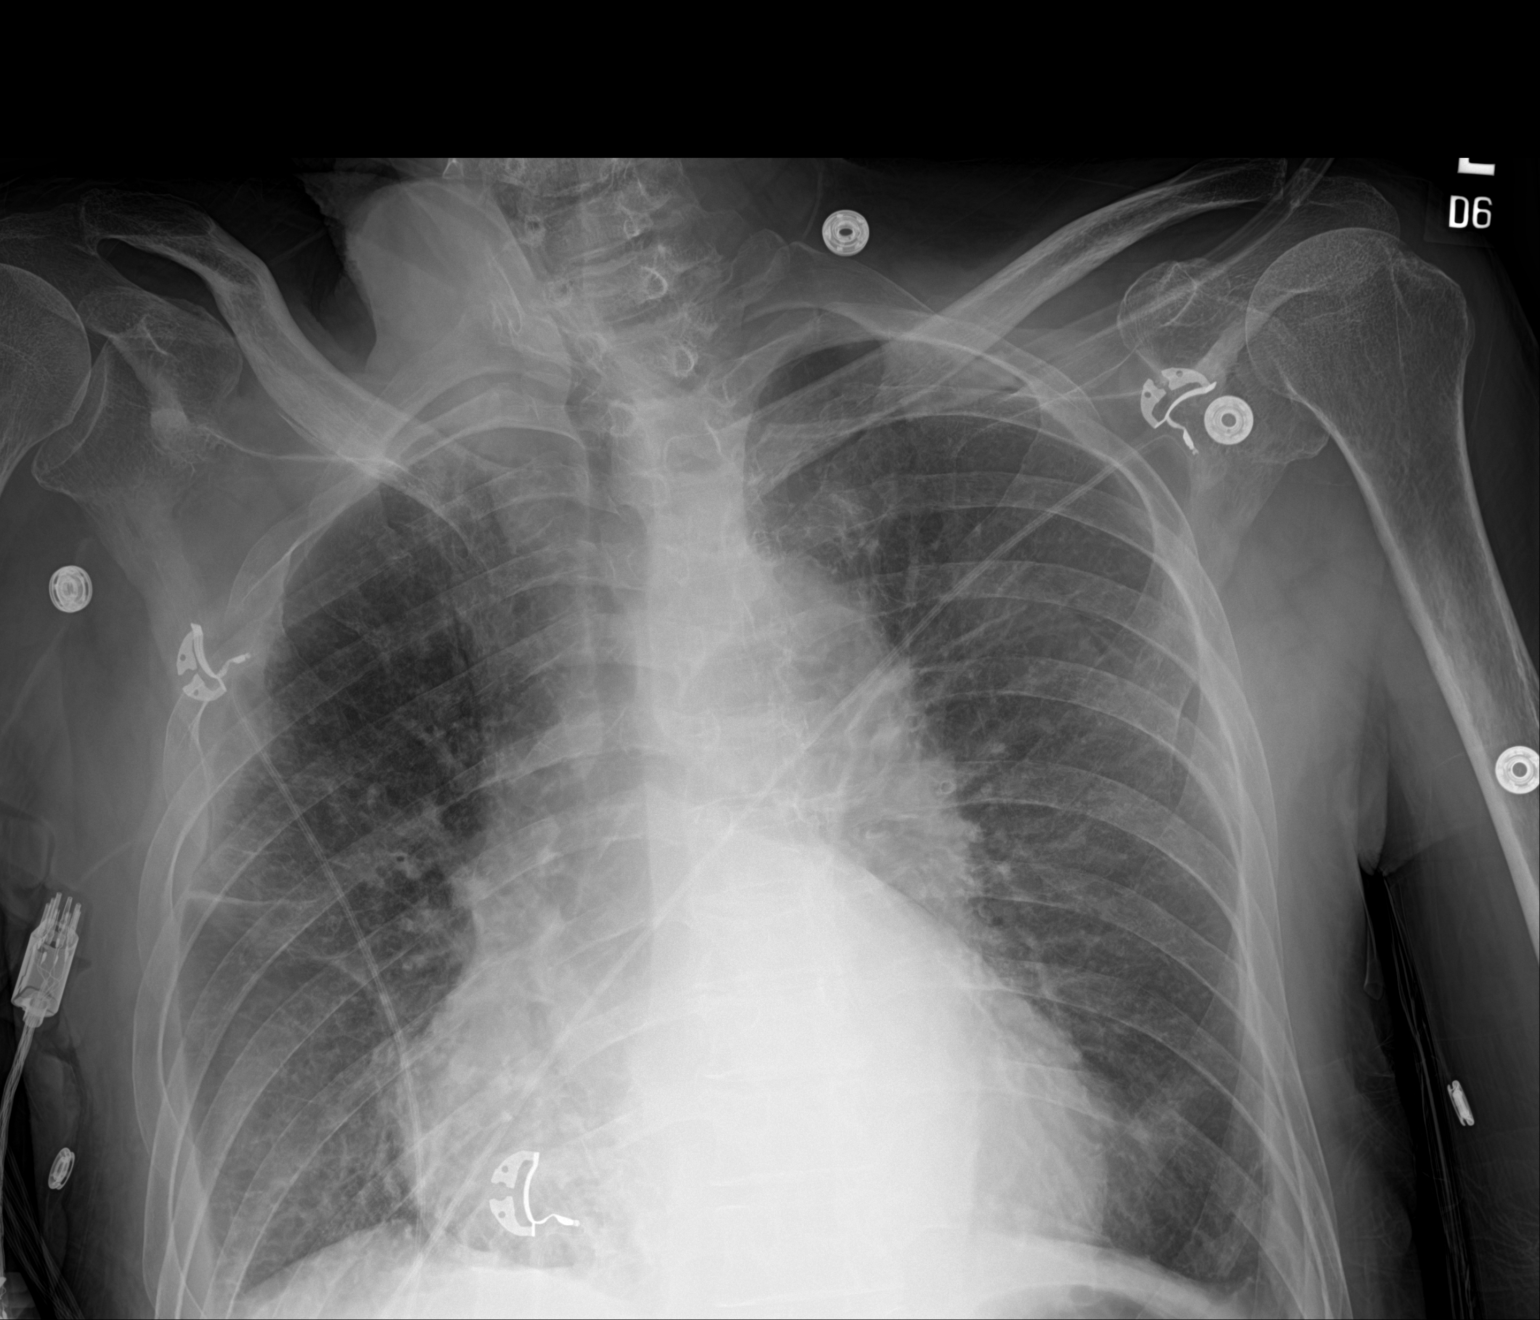

[1 of 1 positions shown; findings below may reference images not displayed]

FINDINGS: There is cardiomegaly. There are prominent interstitial lung
markings which appear to be new since the recent CT. There is
suggestion of a new small right-sided pleural effusion. No focal
infiltrate. No pneumothorax. No definite acute osseous abnormality.
IMPRESSION: Cardiomegaly with findings concerning for developing volume overload
with a small right-sided pleural effusion which is new from prior
CT.
